# Patient Record
Sex: Male | Born: 1960 | Race: White | Hispanic: No | Marital: Married | Smoking: Current every day smoker
Health system: Southern US, Community
[De-identification: ages and names within clinical notes are randomized; demographics above are authoritative.]

## PROBLEM LIST (undated history)

## (undated) DIAGNOSIS — E7800 Pure hypercholesterolemia, unspecified: Secondary | ICD-10-CM

## (undated) DIAGNOSIS — F17200 Nicotine dependence, unspecified, uncomplicated: Secondary | ICD-10-CM

## (undated) DIAGNOSIS — E109 Type 1 diabetes mellitus without complications: Principal | ICD-10-CM

## (undated) HISTORY — DX: Type 1 diabetes mellitus without complications: E10.9

## (undated) HISTORY — PX: OPEN REDUCTION INTERNAL FIXATION (ORIF) HAND: SHX5991

## (undated) HISTORY — DX: Pure hypercholesterolemia, unspecified: E78.00

## (undated) HISTORY — DX: Nicotine dependence, unspecified, uncomplicated: F17.200

---

## 2005-05-14 ENCOUNTER — Ambulatory Visit (HOSPITAL_COMMUNITY): Admission: RE | Admit: 2005-05-14 | Discharge: 2005-05-14 | Payer: Self-pay | Admitting: Family Medicine

## 2007-02-21 ENCOUNTER — Ambulatory Visit (HOSPITAL_COMMUNITY): Admission: RE | Admit: 2007-02-21 | Discharge: 2007-02-21 | Payer: Self-pay | Admitting: Family Medicine

## 2008-04-16 ENCOUNTER — Encounter: Payer: Self-pay | Admitting: Endocrinology

## 2009-03-31 ENCOUNTER — Encounter: Payer: Self-pay | Admitting: Endocrinology

## 2009-04-25 ENCOUNTER — Ambulatory Visit: Payer: Self-pay | Admitting: Endocrinology

## 2009-04-25 DIAGNOSIS — F17200 Nicotine dependence, unspecified, uncomplicated: Secondary | ICD-10-CM | POA: Insufficient documentation

## 2009-04-25 DIAGNOSIS — Z720 Tobacco use: Secondary | ICD-10-CM | POA: Insufficient documentation

## 2009-04-25 DIAGNOSIS — E109 Type 1 diabetes mellitus without complications: Secondary | ICD-10-CM

## 2009-04-25 HISTORY — DX: Nicotine dependence, unspecified, uncomplicated: F17.200

## 2009-04-25 HISTORY — DX: Type 1 diabetes mellitus without complications: E10.9

## 2009-05-09 ENCOUNTER — Ambulatory Visit: Payer: Self-pay | Admitting: Endocrinology

## 2009-05-30 ENCOUNTER — Ambulatory Visit: Payer: Self-pay | Admitting: Endocrinology

## 2009-06-08 ENCOUNTER — Emergency Department (HOSPITAL_COMMUNITY): Admission: EM | Admit: 2009-06-08 | Discharge: 2009-06-08 | Payer: Self-pay | Admitting: Emergency Medicine

## 2009-06-20 ENCOUNTER — Ambulatory Visit: Payer: Self-pay | Admitting: Endocrinology

## 2009-07-20 ENCOUNTER — Ambulatory Visit: Payer: Self-pay | Admitting: Endocrinology

## 2009-07-20 LAB — CONVERTED CEMR LAB: Hgb A1c MFr Bld: 9.5 % — ABNORMAL HIGH (ref 4.6–6.5)

## 2009-08-19 ENCOUNTER — Ambulatory Visit: Payer: Self-pay | Admitting: Endocrinology

## 2009-09-30 ENCOUNTER — Ambulatory Visit: Payer: Self-pay | Admitting: Endocrinology

## 2009-09-30 LAB — CONVERTED CEMR LAB: Hgb A1c MFr Bld: 9.6 % — ABNORMAL HIGH (ref 4.6–6.5)

## 2009-11-11 ENCOUNTER — Ambulatory Visit: Payer: Self-pay | Admitting: Endocrinology

## 2009-12-27 ENCOUNTER — Ambulatory Visit: Payer: Self-pay | Admitting: Endocrinology

## 2009-12-27 DIAGNOSIS — E7800 Pure hypercholesterolemia, unspecified: Secondary | ICD-10-CM | POA: Insufficient documentation

## 2009-12-27 DIAGNOSIS — E785 Hyperlipidemia, unspecified: Secondary | ICD-10-CM | POA: Insufficient documentation

## 2009-12-27 HISTORY — DX: Pure hypercholesterolemia, unspecified: E78.00

## 2009-12-27 LAB — CONVERTED CEMR LAB
BUN: 11 mg/dL (ref 6–23)
CO2: 25 meq/L (ref 19–32)
Calcium: 9 mg/dL (ref 8.4–10.5)
Chloride: 104 meq/L (ref 96–112)
Cholesterol: 188 mg/dL (ref 0–200)
Creatinine, Ser: 0.7 mg/dL (ref 0.4–1.5)
GFR calc non Af Amer: 129.34 mL/min (ref >60)
Glucose, Bld: 317 mg/dL — ABNORMAL HIGH (ref 70–99)
HDL: 34.6 mg/dL — ABNORMAL LOW (ref >39.00)
Hgb A1c MFr Bld: 10 % — ABNORMAL HIGH (ref 4.6–6.5)
LDL Cholesterol: 136 mg/dL — ABNORMAL HIGH (ref 0–99)
Potassium: 4.5 meq/L (ref 3.5–5.1)
Sodium: 136 meq/L (ref 135–145)
TSH: 1.87 microintl units/mL (ref 0.35–5.50)
Total CHOL/HDL Ratio: 5
Triglycerides: 85 mg/dL (ref 0.0–149.0)
VLDL: 17 mg/dL (ref 0.0–40.0)

## 2010-02-07 ENCOUNTER — Ambulatory Visit
Admission: RE | Admit: 2010-02-07 | Discharge: 2010-02-07 | Payer: Self-pay | Source: Home / Self Care | Attending: Endocrinology | Admitting: Endocrinology

## 2010-02-28 NOTE — Assessment & Plan Note (Signed)
Summary: 2 WK FU  STC   Vital Signs:  Patient profile:   50 year old male Height:      66 inches (167.64 cm) Weight:      165.25 pounds (75.11 kg) O2 Sat:      97 % on Room air Temp:     98.4 degrees F (36.89 degrees C) oral Pulse rate:   81 / minute BP sitting:   118 / 82  (left arm) Cuff size:   regular  Vitals Entered By: Josph Macho RMA (May 09, 2009 8:07 AM)  O2 Flow:  Room air CC: 2 week follow up/ CF Is Patient Diabetic? Yes   Primary Provider:  luking  CC:  2 week follow up/ CF.  History of Present Illness: he is now off oral agents, and has increased lantus to 44 units once daily.  he brings a record of his cbg's which i have reviewed today.  it varies from 72 (afternoon) to 300 (hs and am).    Current Medications (verified): 1)  Lantus Solostar 100 Unit/ml Soln (Insulin Glargine) .... 36 Units Daily At Bedtime, and Pen Needles For Use Once Daily 2)  Accu Check Aviva Test Strips .... Use As Directed  Allergies (verified): No Known Drug Allergies  Past History:  Past Medical History: Last updated: 04/25/2009 Current Problems:  SMOKER (ICD-305.1) IDDM (ICD-250.01)  Review of Systems  The patient denies syncope.    Physical Exam  General:  normal appearance.   Psych:  Alert and cooperative; normal mood and affect; normal attention span and concentration.     Impression & Recommendations:  Problem # 1:  IDDM (ICD-250.01) the pattern if his cbg's indicates he needs mealtime insulin, and less lantus  Medications Added to Medication List This Visit: 1)  Lantus Solostar 100 Unit/ml Soln (Insulin glargine) .... 35 units daily at bedtime, and pen needles for use once daily 2)  Humalog Kwikpen 100 Unit/ml Soln (Insulin lispro (human)) .... 5 units two times a day (before breakfast and before supper), and pen needles to use three times a day  Other Orders: Est. Patient Level III (95621)  Patient Instructions: 1)  reduce lantus to 35 units at  night. 2)  add humalog pen 5 units two times a day (before breakfast and before supper). 3)  return 3 weeks. 4)  call if your blood sugar is below 80 Prescriptions: HUMALOG KWIKPEN 100 UNIT/ML SOLN (INSULIN LISPRO (HUMAN)) 5 units two times a day (before breakfast and before supper), and pen needles to use three times a day  #1 box x 11   Entered and Authorized by:   Minus Breeding MD   Signed by:   Minus Breeding MD on 05/09/2009   Method used:   Print then Give to Patient   RxID:   [id number]

## 2010-02-28 NOTE — Assessment & Plan Note (Signed)
Summary: 6 WK FU---STC   Vital Signs:  Patient profile:   50 year old male Height:      66 inches (167.64 cm) Weight:      169.50 pounds (77.05 kg) BMI:     27.46 O2 Sat:      95 % on Room air Temp:     97.5 degrees F (36.39 degrees C) oral Pulse rate:   65 / minute Pulse rhythm:   regular BP sitting:   120 / 76  (left arm) Cuff size:   regular  Vitals Entered By: Brenton Grills CMA Duncan Dull) (December 27, 2009 8:04 AM)  O2 Flow:  Room air CC: 6 week F/U/aj Is Patient Diabetic? Yes   Primary Provider:  luking  CC:  6 week F/U/aj.  History of Present Illness: pt states he feels well in general.  he brings a record of his cbg's which i have reviewed today.  he has mild hypoglycemia approx 2/week.  this happens when he is unable to anticipate exertion.  the hypoglycemia happens at any time of day, except for the middle of the night.   cbg are highest in am (300's).   Current Medications (verified): 1)  Lantus Solostar 100 Unit/ml Soln (Insulin Glargine) .... 30 Units Daily At Bedtime. 2)  Humalog Kwikpen 100 Unit/ml Soln (Insulin Lispro (Human)) .... Three Times A Day (Just Before Each Meal) 06-08-18 Units, and Pen Needles 4x A Day 3)  Accu-Chek Aviva  Strp (Glucose Blood) .... 5x A Day.  250.01, and Lancets 4)  Glucagon Emergency 1 Mg Kit (Glucagon (Rdna)) .... As Dir  Allergies (verified): No Known Drug Allergies  Past History:  Past Medical History: Last updated: 04/25/2009 Current Problems:  SMOKER (ICD-305.1) IDDM (ICD-250.01)  Review of Systems  The patient denies syncope.    Physical Exam  General:  normal appearance.   Pulses:  dorsalis pedis intact bilat.   Extremities:  no deformity.  no ulcer on the feet.  feet are of normal color and temp.  no edema  Neurologic:  sensation is intact to touch on the feet.  Additional Exam:   Hemoglobin A1C       [H]  10.0 %     Impression & Recommendations:  Problem # 1:  IDDM (ICD-250.01) poor control at some  point, a simpler regimen might have to be considered.  Problem # 2:  SMOKER (ICD-305.1) we discussed quitting x 3 minutes.  he declines quit-smoking prescription.  Medications Added to Medication List This Visit: 1)  Lantus Solostar 100 Unit/ml Soln (Insulin glargine) .... 35 units daily at bedtime.  Other Orders: Est. Patient Level III (81191) Tobacco use cessation intermediate 3-10 minutes (99406) TLB-A1C / Hgb A1C (Glycohemoglobin) (83036-A1C) TLB-Lipid Panel (80061-LIPID) TLB-BMP (Basic Metabolic Panel-BMET) (80048-METABOL) TLB-TSH (Thyroid Stimulating Hormone) (84443-TSH) Est. Patient Level III (47829)  Patient Instructions: 1)  blood tests are being ordered for you today.  please call [phone] to hear your test results. 2)  pending the test results, please: 3)  increase lantus to 35 units at night. 4)  continue humalog pen three times a day (just before each meal), 06-08-18 units.  also take 5 units at bedtime if you choose to eat then. 5)  return 6 weeks. 6)  if exercise is upcoming, skip that shot of humalog.  if you cannot predict activity, eat or drink something to prevent low-blood sugar. you should be more aggressive with eating with activity.  this is because we must eliminate the lows in order  to attack the high blood sugars. 7)  (update: i left message on phone-tree:  i offered two times a day premixed insulin).   Orders Added: 1)  Est. Patient Level III [96295] 2)  Tobacco use cessation intermediate 3-10 minutes [99406] 3)  TLB-A1C / Hgb A1C (Glycohemoglobin) [83036-A1C] 4)  TLB-Lipid Panel [80061-LIPID] 5)  TLB-BMP (Basic Metabolic Panel-BMET) [80048-METABOL] 6)  TLB-TSH (Thyroid Stimulating Hormone) [84443-TSH] 7)  Est. Patient Level III [28413]

## 2010-02-28 NOTE — Assessment & Plan Note (Signed)
Summary: 6 WK FU--STC   Vital Signs:  Patient profile:   50 year old male Height:      66 inches (167.64 cm) Weight:      169 pounds (76.82 kg) BMI:     27.38 O2 Sat:      96 % on Room air Temp:     97.5 degrees F (36.39 degrees C) oral Pulse rate:   72 / minute BP sitting:   112 / 66  (left arm) Cuff size:   regular  Vitals Entered By: Brenton Grills MA (September 30, 2009 8:07 AM)  O2 Flow:  Room air CC: 6 week F/U/aj Is Patient Diabetic? Yes   Primary Provider:  luking  CC:  6 week F/U/aj.  History of Present Illness: he brings a record of his cbg's which i have reviewed today.  it is extremely variable (60-400).  it is highest at hs, and in am, and lowest before lunch (he says this is due to activity at work).  pt states he feels well in general.  he sometimes skips humalog if is is going to be active, but he cannot always predict activity.    Current Medications (verified): 1)  Lantus Solostar 100 Unit/ml Soln (Insulin Glargine) .... 25 Units Daily At Bedtime. 2)  Humalog Kwikpen 100 Unit/ml Soln (Insulin Lispro (Human)) .... Three Times A Day (Just Before Each Meal) 15-10-15 Units, and Pen Needles 4x A Day 3)  Accu-Chek Aviva  Strp (Glucose Blood) .... 5x A Day.  250.01, and Lancets  Allergies (verified): No Known Drug Allergies  Past History:  Past Medical History: Last updated: 04/25/2009 Current Problems:  SMOKER (ICD-305.1) IDDM (ICD-250.01)  Review of Systems  The patient denies hypoglycemia.    Physical Exam  General:  normal appearance.   Skin:  insulin injection sites at triceps and anterior abdomen are normal.  Additional Exam:  Hemoglobin A1C       [H]  9.6 %   Impression & Recommendations:  Problem # 1:  IDDM (ICD-250.01) we'll need to markedly reduce the hypoglycemia before getting better control.  Medications Added to Medication List This Visit: 1)  Humalog Kwikpen 100 Unit/ml Soln (Insulin lispro (human)) .... Three times a day (just  before each meal) 06-08-18 units, and pen needles 4x a day  Other Orders: TLB-A1C / Hgb A1C (Glycohemoglobin) (83036-A1C) Est. Patient Level III (14782)  Patient Instructions: 1)  blood tests are being ordered for you today.  please call [phone] to hear your test results. 2)  pending the test results, please: 3)  continue lantus 25 units at night. 4)  change humalog pen three times a day (just before each meal), 06-08-18 units.  also take 5 units at bedtime if you choose to eat then. 5)  return 6 weeks. 6)  if exercise is upcoming, skip that shot of humalog.  if you cannot predict activity, eat or drink something to prevent low-blood sugar.  7)  (update: i left message on phone-tree:  rx as we discussed)

## 2010-02-28 NOTE — Assessment & Plan Note (Signed)
Summary: 6 wk fu---stc   Vital Signs:  Patient profile:   50 year old male Height:      66 inches (167.64 cm) Weight:      169 pounds (76.82 kg) BMI:     27.38 O2 Sat:      95 % on Room air Temp:     97.0 degrees F (36.11 degrees C) oral Pulse rate:   68 / minute BP sitting:   112 / 72  (left arm) Cuff size:   regular  Vitals Entered By: Brenton Grills MA (November 11, 2009 8:05 AM)  O2 Flow:  Room air CC: 6 week F/U/aj Is Patient Diabetic? Yes   Primary Provider:  luking  CC:  6 week F/U/aj.  History of Present Illness: pt states he feels well in general.  he brings a record of his cbg's which i have reviewed today.   it varies widely--from 55-500.  most are over 250.  the lows are with activity.  in each of the instances of hypoglycemia, he took the full dose of humalog at the prior meal, because he was unable to anticipate the activity.    Current Medications (verified): 1)  Lantus Solostar 100 Unit/ml Soln (Insulin Glargine) .... 25 Units Daily At Bedtime. 2)  Humalog Kwikpen 100 Unit/ml Soln (Insulin Lispro (Human)) .... Three Times A Day (Just Before Each Meal) 06-08-18 Units, and Pen Needles 4x A Day 3)  Accu-Chek Aviva  Strp (Glucose Blood) .... 5x A Day.  250.01, and Lancets  Allergies (verified): No Known Drug Allergies  Past History:  Past Medical History: Last updated: 04/25/2009 Current Problems:  SMOKER (ICD-305.1) IDDM (ICD-250.01)  Review of Systems  The patient denies syncope.    Physical Exam  General:  normal appearance.   Psych:  Alert and cooperative; normal mood and affect; normal attention span and concentration.     Impression & Recommendations:  Problem # 1:  IDDM (ICD-250.01) needs increased rx  Medications Added to Medication List This Visit: 1)  Lantus Solostar 100 Unit/ml Soln (Insulin glargine) .... 30 units daily at bedtime. 2)  Glucagon Emergency 1 Mg Kit (Glucagon (rdna)) .... As dir  Other Orders: Admin 1st Vaccine  (78295) Flu Vaccine 53yrs + (62130) Est. Patient Level III (86578)  Patient Instructions: 1)  increase lantus to 30 units at night. 2)  change humalog pen three times a day (just before each meal), 06-08-18 units.  also take 5 units at bedtime if you choose to eat then. 3)  return 6 weeks. 4)  if exercise is upcoming, skip that shot of humalog.  if you cannot predict activity, eat or drink something to prevent low-blood sugar. you should be more aggressive with eating with activity.  this is because we must eliminate the lows in order to attack the high blood sugars. 5)  i have sent a prescription for "glucagon" (single-use emergency kit to reverse low-blood sugar) to [facility].   Prescriptions: HUMALOG KWIKPEN 100 UNIT/ML SOLN (INSULIN LISPRO (HUMAN)) three times a day (just before each meal) 06-08-18 units, and pen needles 4x a day  #1 box x 11   Entered and Authorized by:   Minus Breeding MD   Signed by:   Minus Breeding MD on 11/11/2009   Method used:   Print then Give to Patient   RxID:   [id number] GLUCAGON EMERGENCY 1 MG KIT (GLUCAGON (RDNA)) as dir  #3 x 5   Entered and Authorized by:   Minus Breeding  MD   Signed by:   Minus Breeding MD on 11/11/2009   Method used:   Electronically to        [facility]* (retail)             ,          Ph: [phone]       Fax: [phone]   RxID:   [id number]  Flu Vaccine Consent Questions     Do you have a history of severe allergic reactions to this vaccine? no    Any prior history of allergic reactions to egg and/or gelatin? no    Do you have a sensitivity to the preservative Thimersol? no    Do you have a past history of Guillan-Barre Syndrome? no    Do you currently have an acute febrile illness? no    Have you ever had a severe reaction to latex? no    Vaccine information given and explained to patient? yes    Are you currently pregnant? no    Lot Number:[id number]   Exp Date:07/29/2010   Site Given  Left Deltoid IM               ,          Ph: [phone]       Fax: [phone]   RxID:   [id number]  .[email or web address]

## 2010-02-28 NOTE — Assessment & Plan Note (Signed)
Summary: 1 MTH FU---STC   Vital Signs:  Patient profile:   50 year old male Height:      66 inches Weight:      171.38 pounds BMI:     27.76 O2 Sat:      97 % on Room air Temp:     97.4 degrees F oral Pulse rate:   66 / minute Pulse rhythm:   regular BP sitting:   102 / 78  (left arm) Cuff size:   regular  Vitals Entered By: Brenton Grills MA (July 20, 2009 8:19 AM)  O2 Flow:  Room air CC: 1 mo f/u/pt states he is no longer ibuprofen or vicodin/aj   Primary Provider:  luking  CC:  1 mo f/u/pt states he is no longer ibuprofen or vicodin/aj.  History of Present Illness: pt is having mild hypoglycemia with exertion (and only with exertion), even if he skips the previous shot of humalog.  he often skips the 5 units of humalog if he eats at hs.  he brings a record of his cbg's which i have reviewed today.   Current Medications (verified): 1)  Lantus Solostar 100 Unit/ml Soln (Insulin Glargine) .... 45 Units Daily At Bedtime. 2)  Humalog Kwikpen 100 Unit/ml Soln (Insulin Lispro (Human)) .... Three Times A Day (Just Before Each Meal) 11-02-08 Units, and Pen Needles 4x A Day 3)  Ibuprofen 600 Mg Tabs (Ibuprofen) .... As Needed For Shoulder Pain 4)  Vicodin 5-500 Mg Tabs (Hydrocodone-Acetaminophen) .Marland Kitchen.. 1-2 Q 4-6 Hrs As Needed For Shoulder Pain 5)  Accu-Chek Aviva  Strp (Glucose Blood) .... 5x A Day.  250.01, and Lancets  Allergies (verified): No Known Drug Allergies  Review of Systems  The patient denies syncope.    Physical Exam  General:  normal appearance.   Pulses:  dorsalis pedis intact bilat.   Extremities:  no deformity.  no ulcer on the feet.  feet are of normal color and temp.  no edema  Neurologic:  cn 2-12 grossly intact.   readily moves all 4's.   sensation is intact to touch on the feet.  Additional Exam:  Hemoglobin A1C       [H]  9.5 %    Impression & Recommendations:  Problem # 1:  IDDM (ICD-250.01) needs increased rx  Medications Added to  Medication List This Visit: 1)  Lantus Solostar 100 Unit/ml Soln (Insulin glargine) .... 30 units daily at bedtime. 2)  Humalog Kwikpen 100 Unit/ml Soln (Insulin lispro (human)) .... Three times a day (just before each meal) 15-10-15 units, and pen needles 4x a day  Other Orders: TLB-A1C / Hgb A1C (Glycohemoglobin) (83036-A1C) Est. Patient Level III (82956)  Patient Instructions: 1)  decrease lantus to 30 units at night. 2)  increase humalog pen to three times a day (just before each meal), 15-10-15 units.  also take 5 units at bedtime if you choose to eat then 3)  return 1 month. 4)  call if your blood sugar is below 80. 5)  if exercise is upcoming, skip that shot of humalog. 6)  blood tests are being ordered for you today.  please call [phone] to hear your test results. 7)  here are some samples of "apidra" pens.  please note that, while they look like lantus, they are a substiitute for humalog, not lantus.

## 2010-02-28 NOTE — Assessment & Plan Note (Signed)
Summary: 3 WK FU  STC   Vital Signs:  Patient profile:   50 year old male Height:      66 inches Weight:      165 pounds BMI:     26.73 O2 Sat:      98 % on Room air Temp:     97.5 degrees F oral Pulse rate:   65 / minute BP sitting:   110 / 68  (left arm) Cuff size:   regular  Vitals Entered By: Bill Salinas CMA (May 30, 2009 8:09 AM)  O2 Flow:  Room air CC: 3 week follow up, pt brought a record of his CBGs and they are still running over 200/ ab   Primary Provider:  luking  CC:  3 week follow up and pt brought a record of his CBGs and they are still running over 200/ ab.  History of Present Illness: he brings a record of his cbg's which i have reviewed today.  he has had several episodes of mild hypoglycemia, after exercise, or taking humalog but not eating.   Current Medications (verified): 1)  Lantus Solostar 100 Unit/ml Soln (Insulin Glargine) .... 35 Units Daily At Bedtime, and Pen Needles For Use Once Daily 2)  Accu Check Aviva Test Strips .... Use As Directed 3)  Humalog Kwikpen 100 Unit/ml Soln (Insulin Lispro (Human)) .... 5 Units Two Times A Day (Before Breakfast and Before Supper), and Pen Needles To Use Three Times A Day  Allergies (verified): No Known Drug Allergies  Past History:  Past Medical History: Last updated: 04/25/2009 Current Problems:  SMOKER (ICD-305.1) IDDM (ICD-250.01)  Review of Systems  The patient denies syncope.    Physical Exam  General:  normal appearance.   Psych:  Alert and cooperative; normal mood and affect; normal attention span and concentration.     Impression & Recommendations:  Problem # 1:  IDDM (ICD-250.01) pump therapy is the best way to address his varying needs from day to day.  Medications Added to Medication List This Visit: 1)  Lantus Solostar 100 Unit/ml Soln (Insulin glargine) .... 30 units daily at bedtime, and pen needles for use once daily 2)  Humalog Kwikpen 100 Unit/ml Soln (Insulin lispro (human))  .... 8 units two times a day (before breakfast and before supper), and pen needles to use three times a day  Other Orders: Est. Patient Level III (46270)  Patient Instructions: 1)  reduce lantus to 30 units at night. 2)  increase humalog pen to 8 units two times a day (before breakfast and before supper). 3)  return 3 weeks. 4)  call if your blood sugar is below 80. 5)  the humalog if just before eating, and only when you eat. 6)  if exercise is upcoming, skip that shot of humalog. 7)  you should consider the insulin pump to best meet your needs.  please let me know if you want to have an appointment with a diabetes educator to consider it.

## 2010-02-28 NOTE — Assessment & Plan Note (Signed)
Summary: NEW [employer] ENDO PT--PKG/OFF-UNCONTROL DIABETES--PER BRENDELL/DR...   Vital Signs:  Patient profile:   50 year old male Height:      66 inches (167.64 cm) Weight:      162.50 pounds (73.86 kg) BMI:     26.32 O2 Sat:      99 % on Room air Temp:     98.0 degrees F (36.67 degrees C) oral Pulse rate:   74 / minute BP sitting:   132 / 76  (left arm) Cuff size:   regular  Vitals Entered By: Josph Machohristy Freeman RMA (April 25, 2009 8:40 AM)  O2 Flow:  Room air CC: New Endo: Uncontrolled Diabetes/ CF Is Patient Diabetic? Yes   CC:  New Endo: Uncontrolled Diabetes/ CF.  History of Present Illness: pt states 1 year h/o dm.  he denies knowing of any chronic complications.  he has been on insulin x 2 months.  he takes lantus and 2 oral agents. no cbg record, but states cbg's vary from 40-300.  it is lowest in the afternoon, and in am, and highest at hs.  he has frequent hypoglycemia in am and afternoon. pt says his diet and exercise are "good."   symptomatically, pt denies numbness of the feet over the past year, or associated weight change.   Current Medications (verified): 1)  Lantus Solostar 100 Unit/ml Soln (Insulin Glargine) .... 36 Units Daily At Bedtime 2)  Actos 30 Mg Tabs (Pioglitazone Hcl) .Marland Kitchen... 1 Tab Daily 3)  Metformin Hcl 1000 Mg Tabs (Metformin Hcl) .Marland Kitchen... 1 Tab After A Morning Meal 1/2 Tab After A Meal A Noon 1 Tab After Night Meal 4)  Glyburide 5 Mg Tabs (Glyburide) .Marland Kitchen... 1 Tab in Morning and 2 At Pm 5)  Accu Check Aviva Test Strips .... Use As Directed  Allergies (verified): No Known Drug Allergies  Past History:  Past Medical History: Current Problems:  SMOKER (ICD-305.1) IDDM (ICD-250.01)  Family History: Reviewed history and no changes required. dm: both parents  Social History: Reviewed history and no changes required. married works Medical sales representativeservicing textile equipment.  Review of Systems       denies blurry vision, headache, chest pain, sob, n/v, cramps,  excessive diaphoresis, memory loss, depression, erectily dysfunction, easy bruising, or rhinorrhea.  he has slight blurry vision and polyuria.   Physical Exam  General:  normal appearance.   Head:  head: no deformity eyes: no periorbital swelling, no proptosis external nose and ears are normal mouth: no lesion seen Neck:  Supple without thyroid enlargement or tenderness.  Lungs:  Clear to auscultation bilaterally. Normal respiratory effort.  Heart:  Regular rate and rhythm without murmurs or gallops noted. Normal S1,S2.   Msk:  muscle bulk and strength are grossly normal.  no obvious joint swelling.  gait is normal and steady  Pulses:  dorsalis pedis intact bilat.  no carotid bruit Extremities:  no deformity.  no ulcer on the feet.  feet are of normal color and temp.  no edema  Neurologic:  cn 2-12 grossly intact.   readily moves all 4's.   sensation is intact to touch on the feet  Skin:  normal texture and temp.  no rash.  not diaphoretic  Cervical Nodes:  No significant adenopathy.  Psych:  Alert and cooperative; normal mood and affect; normal attention span and concentration.   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 36 units daily at bedtime 2)  Lantus Solostar 100 Unit/ml Soln (Insulin glargine) .... 36 units daily at bedtime, and pen needles for use once daily 3)  Actos 30 Mg Tabs (Pioglitazone hcl) .Marland Kitchen.. 1 tab daily 4)  Metformin Hcl 1000 Mg Tabs (Metformin hcl) .Marland Kitchen.. 1 tab after a morning meal 1/2 tab after a meal a noon 1 tab after night meal 5)  Glyburide 5 Mg Tabs (Glyburide) .Marland Kitchen.. 1 tab in morning and 2 at pm 6)  Accu Check Aviva Test  Strips  .... Use as directed  Other Orders: Consultation Level IV (41287)  Patient Instructions: 1)  we discussed the importance of diet and exercise therapy and the risks of diabetes.  you should see an eye doctor every year. 2)  it is very important to keep good control of blood pressure and cholesterol, especially in those with diabetes.  stopping smoking also reduces the damage diabetes does to your body.  please discuss these with your doctor.  you should take an aspirin every day, unless you have been advised by a doctor not to. 3)  i told pt we will need to take this complex situation in stages 4)  check your blood sugar 3 times a day.  vary the time of day when you check, between before the 3 meals, and at bedtime.  also check if you have symptoms of your blood sugar being too high or too low.  please keep a record of the readings and bring it to your next appointment here.  please call us sooner if you are having low blood sugar episodes. 5)  for now, stop the diabetes pills.  then increase lantus until blood sugar at some time of day is in the low-100's. 6)  Please schedule a follow-up appointment in 2 weeks. Prescriptions: LANTUS SOLOSTAR 100 UNIT/ML SOLN (INSULIN GLARGINE) 36 units daily at bedtime, and pen needles for use once daily  #3 boxes x 3   Entered and Authorized by:   Minus Breeding MD   Signed by:   Minus Breeding MD on 04/25/2009   Method used:   Electronically to        [facility]* (mail-order)             ,          Ph: [phone]       Fax: [phone]   RxID:   [id number]

## 2010-02-28 NOTE — Assessment & Plan Note (Signed)
Summary: 1 MO ROV /NWS   Vital Signs:  Patient profile:   50 year old male Height:      66 inches (167.64 cm) Weight:      167.75 pounds (76.25 kg) BMI:     27.17 O2 Sat:      98 % on Room air Temp:     97.0 degrees F (36.11 degrees C) oral Pulse rate:   70 / minute BP sitting:   132 / 80  (left arm) Cuff size:   regular  Vitals Entered By: Brenton Grills MA (August 19, 2009 8:09 AM)  O2 Flow:  Room air CC: 1 mo F/U/aj Is Patient Diabetic? Yes   Primary Provider:  luking  CC:  1 mo F/U/aj.  History of Present Illness: he brings a record of his cbg's which i have reviewed today.  it is extremely variable (60-400), with no pattern throughout the day.  pt states he feels well in general.  he sometimes skips humalog if is is goig to be active, but he cannot always predict activity.    Current Medications (verified): 1)  Lantus Solostar 100 Unit/ml Soln (Insulin Glargine) .... 30 Units Daily At Bedtime. 2)  Humalog Kwikpen 100 Unit/ml Soln (Insulin Lispro (Human)) .... Three Times A Day (Just Before Each Meal) 15-10-15 Units, and Pen Needles 4x A Day 3)  Accu-Chek Aviva  Strp (Glucose Blood) .... 5x A Day.  250.01, and Lancets  Allergies (verified): No Known Drug Allergies  Past History:  Past Medical History: Last updated: 04/25/2009 Current Problems:  SMOKER (ICD-305.1) IDDM (ICD-250.01)  Review of Systems  The patient denies syncope.    Physical Exam  General:  normal appearance.   Psych:  Alert and cooperative; normal mood and affect; normal attention span and concentration.     Impression & Recommendations:  Problem # 1:  IDDM (ICD-250.01) Assessment Improved  Medications Added to Medication List This Visit: 1)  Lantus Solostar 100 Unit/ml Soln (Insulin glargine) .... 25 units daily at bedtime.  Other Orders: Est. Patient Level III (76195)  Patient Instructions: 1)  decrease lantus to 25 units at night. 2)  continue humalog pen three times a day (just  before each meal), 15-10-15 units.  also take 5 units at bedtime if you choose to eat then. 3)  return 6 weeks. 4)  if exercise is upcoming, skip that shot of humalog.  if you cannot predict activity, eat or drink something to prevent low-blood sugar.

## 2010-02-28 NOTE — Assessment & Plan Note (Signed)
Summary: 3 WK FU  STC   Vital Signs:  Patient profile:   50 year old male Height:      66 inches (167.64 cm) Weight:      165.50 pounds (75.23 kg) O2 Sat:      97 % on Room air Temp:     97.9 degrees F (36.61 degrees C) oral Pulse rate:   69 / minute BP sitting:   112 / 80  (left arm) Cuff size:   regular  Vitals Entered By: Josph Machohristy Freeman RMA (Jun 20, 2009 8:20 AM)  O2 Flow:  Room air CC: 3 week follow up/ CF Is Patient Diabetic? Yes   Primary Provider:  luking  CC:  3 week follow up/ CF.  History of Present Illness: he brings a record of his cbg's which i have reviewed today.  it is highest in am (300's), despite the fact that he eats only a minimal hs-snack.    Current Medications (verified): 1)  Lantus Solostar 100 Unit/ml Soln (Insulin Glargine) .... 30 Units Daily At Bedtime, and Pen Needles For Use Once Daily 2)  Accu Check Aviva Test Strips .... Use As Directed 3)  Humalog Kwikpen 100 Unit/ml Soln (Insulin Lispro (Human)) .... 8 Units Two Times A Day (Before Breakfast and Before Supper), and Pen Needles To Use Three Times A Day 4)  Ibuprofen 600 Mg Tabs (Ibuprofen) .... As Needed For Shoulder Pain 5)  Vicodin 5-500 Mg Tabs (Hydrocodone-Acetaminophen) .Marland Kitchen... 1-2 Q 4-6 Hrs As Needed For Shoulder Pain  Allergies (verified): No Known Drug Allergies  Past History:  Past Medical History: Last updated: 04/25/2009 Current Problems:  SMOKER (ICD-305.1) IDDM (ICD-250.01)  Review of Systems  The patient denies hypoglycemia.    Physical Exam  General:  normal appearance.   Psych:  Alert and cooperative; normal mood and affect; normal attention span and concentration.     Impression & Recommendations:  Problem # 1:  IDDM (ICD-250.01) hs snack may be causing the high am-cbg's.  Medications Added to Medication List This Visit: 1)  Lantus Solostar 100 Unit/ml Soln (Insulin glargine) .... 45 units daily at bedtime. 2)  Humalog Kwikpen 100 Unit/ml Soln (Insulin  lispro (human)) .... Three times a day (just before each meal) 11-02-08 units, and pen needles 4x a day 3)  Ibuprofen 600 Mg Tabs (Ibuprofen) .... As needed for shoulder pain 4)  Vicodin 5-500 Mg Tabs (Hydrocodone-acetaminophen) .Marland Kitchen... 1-2 q 4-6 hrs as needed for shoulder pain 5)  Accu-chek Aviva Strp (Glucose blood) .... 5x a day.  250.01, and lancets  Other Orders: Est. Patient Level III ([id number]([id number])  Patient Instructions: 1)  increase lantus to 45 units at night. 2)  increase humalog pen to three times a day (just before each meal), 11-02-08 units.  also take 5 units at bedtime if you choose to eat then 3)  return 1 month. 4)  call if your blood sugar is below 80. 5)  if exercise is upcoming, skip that shot of humalog. Prescriptions: ACCU-CHEK AVIVA  STRP (GLUCOSE BLOOD) 5x a day.  250.01, and lancets  #540 x 3   Entered and Authorized by:   Minus BreedingSean A Delmar Dondero MD   Signed by:   Minus BreedingSean A Trevan Messman MD on 06/20/2009   Method used:   Electronically to        [facility]* (mail-order)             ,          Ph: [phone]  Fax: [phone]   RxID:   [id number]

## 2010-03-02 NOTE — Assessment & Plan Note (Signed)
Summary: 6 WK FU  STC   Vital Signs:  Patient profile:   50 year old male Height:      66 inches (167.64 cm) Weight:      173 pounds (78.64 kg) BMI:     28.02 O2 Sat:      97 % on Room air Temp:     98.0 degrees F (36.67 degrees C) oral Pulse rate:   66 / minute Pulse rhythm:   regular BP sitting:   110 / 78  (left arm) Cuff size:   regular  Vitals Entered By: Brenton GrillsAshley Johnson CMA Duncan Dull(AAMA) (February 07, 2010 8:03 AM)  O2 Flow:  Room air CC: 6 week F/U/aj Is Patient Diabetic? Yes   Primary Provider:  luking  CC:  6 week F/U/aj.  History of Present Illness: pt states he feels well in general.  he starts to get lightheaded when his glucose goes below 100.  he brings a record of his cbg's which i have reviewed today.  it varies from 53-400, with no trend throughout the day.  he says his insulin requirements are no different on days he works.    Current Medications (verified): 1)  Lantus Solostar 100 Unit/ml Soln (Insulin Glargine) .... 35 Units Daily At Bedtime. 2)  Humalog Kwikpen 100 Unit/ml Soln (Insulin Lispro (Human)) .... Three Times A Day (Just Before Each Meal) 06-08-18 Units, and Pen Needles 4x A Day 3)  Accu-Chek Aviva  Strp (Glucose Blood) .... 5x A Day.  250.01, and Lancets 4)  Glucagon Emergency 1 Mg Kit (Glucagon (Rdna)) .... As Dir  Allergies (verified): No Known Drug Allergies  Past History:  Past Medical History: Last updated: 04/25/2009 Current Problems:  SMOKER (ICD-305.1) IDDM (ICD-250.01)  Review of Systems  The patient denies syncope.    Physical Exam  General:  normal appearance.   Psych:  Alert and cooperative; normal mood and affect; normal attention span and concentration.     Impression & Recommendations:  Problem # 1:  IDDM (ICD-250.01) here we should try a simpler regimen  Medications Added to Medication List This Visit: 1)  Novolog Mix 70/30 Flexpen 70-30 % Susp (Insulin aspart prot & aspart) .... 40 units with breakfast, and 25 units  with evening meal, and pen needles two times a day  Other Orders: Est. Patient Level III ([id number]([id number])  Patient Instructions: 1)  change both current insulins to novolog mix 70/30, 40 units with breakfast, and 25 units with the evening meal. 2)  Please schedule a follow-up appointment in 6 weeks. 3)  check your blood sugar 4 times a day--before the 3 meals, and at bedtime.  also check if you have symptoms of your blood sugar being too high or too low.  please keep a record of the readings and bring it to your next appointment here.  please call us sooner if you are having low blood sugar episodes, or if it is persistently over 200. Prescriptions: NOVOLOG MIX 70/30 FLEXPEN 70-30 % SUSP (INSULIN ASPART PROT & ASPART) 40 units with breakfast, and 25 units with evening meal, and pen needles two times a day  #2 boxes x 11   Entered and Authorized by:   Minus BreedingSean A Rashad Obeid MD   Signed by:   Minus BreedingSean A Annayah Worthley MD on 02/07/2010   Method used:   Electronically to        [facility]* (retail)       [address]       [place]  [place], [place]  [address]       Ph: [phone]       Fax: [phone]   RxID:   [id number]    Orders Added: 1)  Est. Patient Level III [43329]

## 2010-03-21 ENCOUNTER — Ambulatory Visit (INDEPENDENT_AMBULATORY_CARE_PROVIDER_SITE_OTHER): Payer: BC Managed Care – PPO | Admitting: Endocrinology

## 2010-03-21 ENCOUNTER — Encounter: Payer: Self-pay | Admitting: Endocrinology

## 2010-03-21 DIAGNOSIS — E109 Type 1 diabetes mellitus without complications: Secondary | ICD-10-CM

## 2010-03-28 NOTE — Assessment & Plan Note (Signed)
Summary: 6 WK FU STC   Vital Signs:  Patient profile:   50 year old male Height:      66 inches (167.64 cm) Weight:      174.13 pounds (79.15 kg) BMI:     28.21 O2 Sat:      97 % on Room air Temp:     98.0 degrees F (36.67 degrees C) oral Pulse rate:   67 / minute Pulse rhythm:   regular BP sitting:   108 / 78  (left arm) Cuff size:   regular  Vitals Entered By: Brenton Grills CMA Duncan Dull) (March 21, 2010 8:05 AM)  O2 Flow:  Room air CC: 6 week F/U/aj Is Patient Diabetic? Yes   Primary Provider:  luking  CC:  6 week F/U/aj.  History of Present Illness: pt states he feels well in general.  he brings a record of his cbg's which i have reviewed today.  it is often low during the day with exertion, or in am if he does not eat at hs. he often takes less than the prescribed insulin dosage in am, then takes some at lunch.  he then sometimes takes extra insulin with the evening meal.    Current Medications (verified): 1)  Accu-Chek Aviva  Strp (Glucose Blood) .... 5x A Day.  250.01, and Lancets 2)  Glucagon Emergency 1 Mg Kit (Glucagon (Rdna)) .... As Dir 3)  Novolog Mix 70/30 Flexpen 70-30 % Susp (Insulin Aspart Prot & Aspart) .... 40 Units With Breakfast, and 25 Units With Evening Meal, and Pen Needles Two Times A Day  Allergies (verified): No Known Drug Allergies  Past History:  Past Medical History: Last updated: 04/25/2009 Current Problems:  SMOKER (ICD-305.1) IDDM (ICD-250.01)  Review of Systems  The patient denies syncope.    Physical Exam  General:  normal appearance.   Neck:  Supple without thyroid enlargement or tenderness.    Impression & Recommendations:  Problem # 1:  IDDM (ICD-250.01) this insulin schedule is still too complex for him.  we discussed this vs qam levemir, and he wants to continue the same insulin type for now.    Medications Added to Medication List This Visit: 1)  Novolog Mix 70/30 Flexpen 70-30 % Susp (Insulin aspart prot & aspart)  .... 35 units with breakfast, and 25 units with evening meal, and pen needles two times a day  Other Orders: Est. Patient Level III (60454)  Patient Instructions: 1)  change novolog mix 70/30, to 35 units with breakfast (25 if you are going to be active during the day), and 25 units with the evening meal.  if you are unable to anticipate activity, eat a light snack for the activity. 2)  Please schedule a follow-up appointment in 1 month. 3)  check your blood sugar 4 times a day--before the 3 meals, and at bedtime.  also check if you have symptoms of your blood sugar being too high or too low.  please keep a record of the readings and bring it to your next appointment here.  please call us sooner if you are having low blood sugar episodes, or if it is persistently over 200.   Orders Added: 1)  Est. Patient Level III [09811]

## 2010-04-21 ENCOUNTER — Encounter: Payer: Self-pay | Admitting: Endocrinology

## 2010-04-21 ENCOUNTER — Ambulatory Visit (INDEPENDENT_AMBULATORY_CARE_PROVIDER_SITE_OTHER): Payer: BC Managed Care – PPO | Admitting: Endocrinology

## 2010-04-21 ENCOUNTER — Other Ambulatory Visit (INDEPENDENT_AMBULATORY_CARE_PROVIDER_SITE_OTHER): Payer: BC Managed Care – PPO

## 2010-04-21 VITALS — BP 108/72 | HR 67 | Temp 97.9°F | Ht 66.0 in | Wt 172.6 lb

## 2010-04-21 DIAGNOSIS — E1069 Type 1 diabetes mellitus with other specified complication: Secondary | ICD-10-CM

## 2010-04-21 DIAGNOSIS — E109 Type 1 diabetes mellitus without complications: Secondary | ICD-10-CM

## 2010-04-21 DIAGNOSIS — E1065 Type 1 diabetes mellitus with hyperglycemia: Secondary | ICD-10-CM

## 2010-04-21 LAB — HEMOGLOBIN A1C: Hgb A1c MFr Bld: 9.8 % — ABNORMAL HIGH (ref 4.6–6.5)

## 2010-04-21 NOTE — Progress Notes (Signed)
  Subjective:    Patient ID: Ronald Wilkinson, male    DOB: 05/01/1960, 50 y.o.   MRN: [id number]  HPI he brings a record of his cbg's which i have reviewed today.  He continues to have frequent hypoglycemia during the day.  This is happening both because he is sometimes unable to anticipate activity, and sometimes because it happens despite taking only 25 units in the morning.  In the am, cbg's are 200-400.   Past Medical History  Diagnosis Date  . IDDM 04/25/2009  . HYPERCHOLESTEROLEMIA 12/27/2009  . SMOKER 04/25/2009   No past surgical history on file.  reports that he has been smoking.  He does not have any smokeless tobacco history on file. He reports that he does not drink alcohol or use illicit drugs. family history includes Diabetes in his father and mother. No Known Allergies   Review of Systems Denies loc    Objective:   Physical Exam dorsalis pedis intact bilat.  no carotid bruit no deformity.  no ulcer on the feet.  feet are of normal color and temp.  no edema sensation is intact to touch on the feet      Assessment & Plan:  Dm, overcontrolled due to activity

## 2010-04-21 NOTE — Patient Instructions (Addendum)
change novolog mix 70/30, to 25 units with breakfast (15 if you are going to be active during the day), and 25 units with the evening meal.  if you are unable to anticipate activity, eat a light snack for the activity. Please schedule a follow-up appointment in 1 month. check your blood sugar 4 times a day--before the 3 meals, and at bedtime.  also check if you have symptoms of your blood sugar being too high or too low.  please keep a record of the readings and bring it to your next appointment here.  please call us sooner if you are having low blood sugar episodes, or if it is persistently over 200.

## 2010-05-26 ENCOUNTER — Ambulatory Visit (INDEPENDENT_AMBULATORY_CARE_PROVIDER_SITE_OTHER): Payer: BC Managed Care – PPO | Admitting: Endocrinology

## 2010-05-26 ENCOUNTER — Encounter: Payer: Self-pay | Admitting: Endocrinology

## 2010-05-26 DIAGNOSIS — E109 Type 1 diabetes mellitus without complications: Secondary | ICD-10-CM

## 2010-05-26 NOTE — Progress Notes (Signed)
  Subjective:    Patient ID: Ronald Wilkinson, male    DOB: [birth year], 50 y.o.   MRN: [id number]  HPI he brings a record of his cbg's which i have reviewed today.  It varies from 80-300.  It is still lowest in the afternoon.  He says this happens when he is unable to anticipate activity during the day.  pt states he feels well in general. Past Medical History  Diagnosis Date  . IDDM 04/25/2009  . HYPERCHOLESTEROLEMIA 12/27/2009  . SMOKER 04/25/2009   No past surgical history on file.  reports that he has been smoking.  He does not have any smokeless tobacco history on file. He reports that he does not drink alcohol or use illicit drugs. family history includes Diabetes in his father and mother. No Known Allergies  Review of Systems denies hypoglycemia.    Objective:   Physical Exam GENERAL: no distress Alert and oriented x 3.  Does not appear anxious nor depressed.      Assessment & Plan:  Dm, needs increased rx

## 2010-05-26 NOTE — Patient Instructions (Addendum)
change novolog mix 70/30, to 35 units with breakfast (25 if you are going to be active during the day), and 40 units with the evening meal.  if you are unable to anticipate activity, eat a light snack for the activity. Please schedule a follow-up appointment in 1 month. check your blood sugar 4 times a day--before the 3 meals, and at bedtime.  also check if you have symptoms of your blood sugar being too high or too low.  please keep a record of the readings and bring it to your next appointment here.  please call us sooner if you are having low blood sugar episodes, or if it is persistently over 200.

## 2010-06-04 ENCOUNTER — Other Ambulatory Visit: Payer: Self-pay | Admitting: Endocrinology

## 2010-06-30 ENCOUNTER — Ambulatory Visit (INDEPENDENT_AMBULATORY_CARE_PROVIDER_SITE_OTHER): Payer: BC Managed Care – PPO | Admitting: Endocrinology

## 2010-06-30 ENCOUNTER — Encounter: Payer: Self-pay | Admitting: Endocrinology

## 2010-06-30 DIAGNOSIS — E109 Type 1 diabetes mellitus without complications: Secondary | ICD-10-CM

## 2010-06-30 NOTE — Progress Notes (Signed)
  Subjective:    Patient ID: Ronald Wilkinson, male    DOB: 08-07-1960, 50 y.o.   MRN: [id number]  HPI pt states he feels well in general.  Pt says cbg's are continuing to improve.  he brings a record of his cbg's which i have reviewed today.  It is still low with activity.  He says he is taking only 20 units with breakfast.  He then takes the additional 15 units later, depending on his cbg and activity.  He does about the same with his pm insulin.  cbg's are mildly low approx 2/week, with no pattern throughout the day.  It is sometimes as high as 400, but most are 100-220. Past Medical History  Diagnosis Date  . IDDM 04/25/2009  . HYPERCHOLESTEROLEMIA 12/27/2009  . SMOKER 04/25/2009    No past surgical history on file.  History   Social History  . Marital Status: Married    Spouse Name: N/A    Number of Children: N/A  . Years of Education: N/A   Occupational History  .      textiles   Social History Main Topics  . Smoking status: Current Everyday Smoker -- 1.5 packs/day  . Smokeless tobacco: Not on file  . Alcohol Use: No  . Drug Use: No  . Sexually Active: Not on file   Other Topics Concern  . Not on file   Social History Narrative   Works Medical sales representative.    Current Outpatient Prescriptions on File Prior to Visit  Medication Sig Dispense Refill  . ACCU-CHEK AVIVA PLUS test strip USE 5 TIMES A DAY  540 strip  2  . glucagon (GLUCAGON EMERGENCY) 1 MG injection As directed       . insulin aspart protamine-insulin aspart (NOVOLOG MIX 70/30 FLEXPEN) (70-30) 100 UNIT/ML injection Inject into the skin. 35 units with breakfast, and 40 with evening meal, and pen needles 2/day.         No Known Allergies  Family History  Problem Relation Age of Onset  . Diabetes Mother   . Diabetes Father     BP 128/82  Pulse 70  Temp(Src) 97.4 F (36.3 C) (Oral)  Ht 6' (1.829 m)  Wt 173 lb 12.8 oz (78.835 kg)  BMI 23.57 kg/m2  SpO2 95%    Review of  Systems Denies loc    Objective:   Physical Exam Pulses: dorsalis pedis intact bilat.   Feet: no deformity.  no ulcer on the feet.  feet are of normal color and temp.  no edema Neuro: sensation is intact to touch on the feet.       Assessment & Plan:  Type 1 dm.  therapy limited by his constant varying of the insulin dosage.  Also, cbg's depend heavily on activity.

## 2010-06-30 NOTE — Patient Instructions (Addendum)
change novolog mix 70/30, to 25 units with breakfast, and 40 units with the evening meal.  if you are unable to anticipate activity, eat a light snack for the activity. Please schedule a follow-up appointment in 4-6 weeks.   check your blood sugar 4 times a day--before the 3 meals, and at bedtime.  also check if you have symptoms of your blood sugar being too high or too low.  please keep a record of the readings and bring it to your next appointment here.  please call us sooner if you are having low blood sugar episodes, or if it is persistently over 200.

## 2010-08-04 ENCOUNTER — Ambulatory Visit: Payer: BC Managed Care – PPO | Admitting: Endocrinology

## 2011-03-10 ENCOUNTER — Emergency Department (HOSPITAL_COMMUNITY)
Admission: EM | Admit: 2011-03-10 | Discharge: 2011-03-10 | Disposition: A | Discharge status: Home / Self Care | Payer: BC Managed Care – PPO | Attending: Emergency Medicine | Admitting: Emergency Medicine

## 2011-03-10 ENCOUNTER — Encounter (HOSPITAL_COMMUNITY): Payer: Self-pay

## 2011-03-10 DIAGNOSIS — X500XXA Overexertion from strenuous movement or load, initial encounter: Secondary | ICD-10-CM | POA: Insufficient documentation

## 2011-03-10 DIAGNOSIS — F17200 Nicotine dependence, unspecified, uncomplicated: Secondary | ICD-10-CM | POA: Insufficient documentation

## 2011-03-10 DIAGNOSIS — S99929A Unspecified injury of unspecified foot, initial encounter: Secondary | ICD-10-CM | POA: Insufficient documentation

## 2011-03-10 DIAGNOSIS — S8990XA Unspecified injury of unspecified lower leg, initial encounter: Secondary | ICD-10-CM | POA: Insufficient documentation

## 2011-03-10 DIAGNOSIS — E119 Type 2 diabetes mellitus without complications: Secondary | ICD-10-CM | POA: Insufficient documentation

## 2011-03-10 DIAGNOSIS — M25569 Pain in unspecified knee: Secondary | ICD-10-CM | POA: Insufficient documentation

## 2011-03-10 DIAGNOSIS — M79609 Pain in unspecified limb: Secondary | ICD-10-CM | POA: Insufficient documentation

## 2011-03-10 DIAGNOSIS — E7800 Pure hypercholesterolemia, unspecified: Secondary | ICD-10-CM | POA: Insufficient documentation

## 2011-03-10 DIAGNOSIS — S86909A Unspecified injury of unspecified muscle(s) and tendon(s) at lower leg level, unspecified leg, initial encounter: Secondary | ICD-10-CM

## 2011-03-10 MED ORDER — OXYCODONE-ACETAMINOPHEN 5-325 MG PO TABS: 1.0000 | ORAL_TABLET | ORAL | Status: AC | PRN

## 2011-03-10 MED ORDER — NAPROXEN 500 MG PO TABS: 500.0000 mg | ORAL_TABLET | Freq: Two times a day (BID) | ORAL | Status: AC

## 2011-03-10 NOTE — ED Notes (Signed)
Having left lower leg pain, twisted and felt a pop around 10:30am today.

## 2011-03-10 NOTE — ED Notes (Signed)
Pt a/ox4. Resp even and unlabored. NAD at this time. D/C instructions reviewed with pt. Pt verbalized understanding. Pt verbalized understanding. Pt ambulated to lobby with steady gate.

## 2011-03-10 NOTE — ED Notes (Signed)
Pt presented to the Ed with crutches and a problem in the left leg. Pt states that he was squatting earlier today and when he turned the leg "gave out" and he hear a "pop" he fell to the floor. Pt states that he is unable to walk or put any weight on it. Pt states at home he put ice on his leg. Pt states he put ice on it and it has not gotten better. Pt states that his pain is a 8 on a scale of 0-10. Pt is alert and oriented X 4 and resp even and unlabored.

## 2011-03-13 NOTE — ED Provider Notes (Signed)
History     CSN: [id number]  Arrival date & time 03/10/11  1719   First MD Initiated Contact with Patient 03/10/11 1815      Chief Complaint  Patient presents with  . Leg Pain    (Consider location/radiation/quality/duration/timing/severity/associated sxs/prior treatment) HPI Comments: Patient c/o pain to the left lateral knee that began suddenly while he squatting down and twisted.  States he felt a "pop" to the knee and now has pain with weight bearing or movement .  He denies numbness, swelling or calf pain.  Patient is a 51 y.o. male presenting with knee pain. The history is provided by the patient. No language interpreter was used.  Knee Pain This is a new problem. The current episode started today. The problem occurs constantly. The problem has been unchanged. Associated symptoms include arthralgias. Pertinent negatives include no chest pain, fatigue, fever, joint swelling, myalgias, neck pain, numbness, rash or weakness. The symptoms are aggravated by twisting, walking and standing. He has tried nothing for the symptoms. The treatment provided no relief.    Past Medical History  Diagnosis Date  . IDDM 04/25/2009  . HYPERCHOLESTEROLEMIA 12/27/2009  . SMOKER 04/25/2009    History reviewed. No pertinent past surgical history.  Family History  Problem Relation Age of Onset  . Diabetes Mother   . Diabetes Father     History  Substance Use Topics  . Smoking status: Current Everyday Smoker -- 1.5 packs/day  . Smokeless tobacco: Not on file  . Alcohol Use: Yes     occ      Review of Systems  Constitutional: Negative for fever and fatigue.  HENT: Negative for neck pain.   Respiratory: Negative for shortness of breath.   Cardiovascular: Negative for chest pain and palpitations.  Genitourinary: Negative for dysuria, hematuria and flank pain.  Musculoskeletal: Positive for arthralgias. Negative for myalgias, back pain and joint swelling.  Skin: Negative for rash.    Neurological: Negative for dizziness, weakness and numbness.  Hematological: Does not bruise/bleed easily.  All other systems reviewed and are negative.    Allergies  Review of patient's allergies indicates no known allergies.  Home Medications   Current Outpatient Rx  Name Route Sig Dispense Refill  . ACCU-CHEK AVIVA PLUS VI STRP  USE 5 TIMES A DAY 540 strip 2  . GLUCAGON (RDNA) 1 MG IJ KIT  As directed     . INSULIN ASPART PROT & ASPART (70-30) 100 UNIT/ML Center SUSP Subcutaneous Inject into the skin. 25 units with breakfast, and 40 with evening meal, and pen needles 2/day.    Marland Kitchen. NAPROXEN 500 MG PO TABS Oral Take 1 tablet (500 mg total) by mouth 2 (two) times daily. 20 tablet 0  . OXYCODONE-ACETAMINOPHEN 5-325 MG PO TABS Oral Take 1 tablet by mouth every 4 (four) hours as needed for pain. 24 tablet 0    BP 116/71  Pulse 85  Temp(Src) 98.5 F (36.9 C) (Oral)  Resp 20  Ht 6' (1.829 m)  Wt 180 lb (81.647 kg)  BMI 24.41 kg/m2  SpO2 99%  Physical Exam  Nursing note and vitals reviewed. Constitutional: He is oriented to person, place, and time. He appears well-developed and well-nourished. No distress.  HENT:  Head: Normocephalic and atraumatic.  Neck: Normal range of motion. Neck supple.  Cardiovascular: Normal rate, regular rhythm and normal heart sounds.   Pulmonary/Chest: Effort normal and breath sounds normal. No respiratory distress.  Musculoskeletal: Normal range of motion. He exhibits tenderness. He exhibits no  edema.       Left knee: He exhibits normal range of motion, no swelling, no effusion, no ecchymosis, no deformity, no laceration, no erythema and no bony tenderness. tenderness found. Lateral joint line tenderness noted. No patellar tendon tenderness noted.       Legs: Lymphadenopathy:    He has no cervical adenopathy.  Neurological: He is alert and oriented to person, place, and time. He exhibits normal muscle tone. Coordination normal.  Skin: Skin is warm and  dry.    ED Course  Procedures (including critical care time)  Labs Reviewed - No data to display No results found.   1. Lower leg, muscle or tendon injury       MDM    Pt has ttp of the lateral left knee.  rom is preserved.  No effusion or edema.  No deformity or step-offs.likely ligament injury.   Pt agrees to close f/u with orthopedics.    Patient / Family / Caregiver understand and agree with initial ED impression and plan with expectations set for ED visit. Pt stable in ED with no significant deterioration in condition.       Gabriellia Rempel L. [place], [place] 03/13/11 2127

## 2011-03-15 NOTE — ED Provider Notes (Signed)
Medical screening examination/treatment/procedure(s) were performed by non-physician practitioner and as supervising physician I was immediately available for consultation/collaboration.   Geoffery Lyons, MD 03/15/11 [phone]

## 2011-07-13 ENCOUNTER — Telehealth (HOSPITAL_COMMUNITY): Payer: Self-pay | Admitting: Dietician

## 2011-07-13 NOTE — Telephone Encounter (Signed)
Received referral from Dr. Ouida Sills for dx: diabetes via fax on 07/12/11.

## 2011-07-19 NOTE — Telephone Encounter (Signed)
Sent letter to pt home via [employer] Mail in attempt to contact pt to schedule appointment.  

## 2011-07-26 NOTE — Telephone Encounter (Signed)
Sent letter to pt home via [employer] Mail in attempt to contact pt to schedule appointment.  

## 2011-07-31 NOTE — Telephone Encounter (Signed)
Pt has not responded to attempts to contact to schedule appointment. Referral filed.  

## 2011-09-07 ENCOUNTER — Telehealth (HOSPITAL_COMMUNITY): Payer: Self-pay | Admitting: Dietician

## 2011-09-07 NOTE — Telephone Encounter (Signed)
Appointment scheduled for 09/10/11 at 9:00 AM.

## 2011-09-10 ENCOUNTER — Encounter (HOSPITAL_COMMUNITY): Payer: Self-pay | Admitting: Dietician

## 2011-09-10 NOTE — Progress Notes (Signed)
Follow-Up Outpatient Nutrition Note Date: 09/10/2011  Appt Start Time: 8:54 AM  Nutrition Assessment:  Current weight: Weight: 174 lb (78.926 kg)  BMI: Body mass index is 23.60 kg/(m^2).  Weight changes: +10# (6%) x 1 year  Ronald Wilkinson last appointment with RD was 09/06/10. Noted improvement in Hgb A1c, but still not optimally controlled (A1c: 9.8 on 09/06/10 vs A1c: 7.9 on 07/14/11). Pt denies any significant lifestyle changes. He has decreased eating out from 3-4 times per week to 1-2 times per week. He drinks low calorie beverages. He reports his main problems are late night snacking and eating candy bars for snack during the day. Upon further questioning, pt reports that he is on sliding scale insulin and he is afraid of hypoglycemia. He reports CBGS between 40-60 in the afternoons (CBGs usually run between 200-250 throughout the rest of the day (pt checks 5 times daily). He reported fasting CBGs was 227 this AM.) He reports that he eats the candy bar to raise his blood sugar and to tide him over until he gets home from work. He reveals that his job is very demanding and he never knows when he is going to get off, and he is often called back in after his shift to resolve a problem. He also reports he occasionally will not take his HS Insulin if he is afraid that he will experience hypoglycemia in the morning.  He reports he snacks in front of the TV late at night.   Labs: CMP     Component Value Date/Time   NA 136 12/27/2009 0818   K 4.5 12/27/2009 0818   CL 104 12/27/2009 0818   CO2 25 12/27/2009 0818   GLUCOSE 317* 12/27/2009 0818   BUN 11 12/27/2009 0818   CREATININE 0.7 12/27/2009 0818   CALCIUM 9.0 12/27/2009 0818   GFRNONAA 129.34 12/27/2009 0818    Lipid Panel     Component Value Date/Time   CHOL 188 12/27/2009 0818   TRIG 85.0 12/27/2009 0818   HDL 34.60* 12/27/2009 0818   CHOLHDL 5 12/27/2009 0818   VLDL 17.0 12/27/2009 0818   LDLCALC 136* 12/27/2009 0818     Lab Results    Component Value Date   HGBA1C 9.8* 04/21/2010   HGBA1C 10.0* 12/27/2009   HGBA1C 9.6* 09/30/2009   Lab Results  Component Value Date   LDLCALC 136* 12/27/2009   CREATININE 0.7 12/27/2009     Diet recall: Breakfast (9:30 AM): bacon and egg sandwich on white wheat bread, tea with spelnda, potato chips; Lunch (12:00 PM): sandwich on white wheat bread, individual bag Lays potato chips, tea with splenda; Snack (2:30 PM): candy bar (snickers) with diet Pepsi; Dinner (5:30-6 PM): pasta salad, biscuit with tomato slice; Snack (8:30-9:00 PM): banana sandwich, coke zero OR leftovers from supper  Nutrition Diagnosis: Excessive carbohydrate intake r/t excessive snacking continues  Nutrition Intervention: Nutrition rx: 1800 kcal NAS, diabetic diet; 3 meals per day (60-75 grams carbohydrate per meal); and 2 snacks (1 PM, 1 HS) (15-30 grams carbohydrate per snack); low calorie beverages only  Education/ counseling provided: Reviewed diabetic diet principles with pt and pt wife. Discussed importance of regular meal schedule, particularly when on Insulin. Discussed healthier options for snacks, such as fruit or peanut butter or crackers. Discussed ways to prevent hypoglycemia and action plan to treat hypoglycemia (drink 4 oz of sugary beverage and then check glucose again to ensure rise in blood sugar). Discussed importance of taking HS insulin and eating an HS snack to  prevent AM hypoglycemia. Discussed importance of portion control and not eating in front of the television to become more aware of amount of food being consumed.   Understanding/Motivation/ Ability to follow recommendations: Expect fair compliance.   Monitoring and Evaluation: Goals for next visit: 1) Weight maintenance; 2) Hgb A1c < 7.0  Recommendations: 1) Eat HS snack with HS Insulin to prevent hypoglycemia; 2) Continue to monitor blood sugar closely; 3) Do not eat in front of the television   F/U: PRN. Provided RD contact information.    Melody Haver, RD, LDN Date:09/10/2011 Appt EndTime: 9:35 AM

## 2012-08-28 ENCOUNTER — Encounter (INDEPENDENT_AMBULATORY_CARE_PROVIDER_SITE_OTHER): Payer: Self-pay | Admitting: *Deleted

## 2012-09-04 ENCOUNTER — Encounter (INDEPENDENT_AMBULATORY_CARE_PROVIDER_SITE_OTHER): Payer: Self-pay | Admitting: *Deleted

## 2012-09-04 ENCOUNTER — Other Ambulatory Visit (INDEPENDENT_AMBULATORY_CARE_PROVIDER_SITE_OTHER): Payer: Self-pay | Admitting: *Deleted

## 2012-09-04 DIAGNOSIS — Z1211 Encounter for screening for malignant neoplasm of colon: Secondary | ICD-10-CM

## 2012-09-04 DIAGNOSIS — Z800 Family history of malignant neoplasm of digestive organs: Secondary | ICD-10-CM

## 2012-09-10 ENCOUNTER — Telehealth (INDEPENDENT_AMBULATORY_CARE_PROVIDER_SITE_OTHER): Payer: Self-pay | Admitting: *Deleted

## 2012-09-10 DIAGNOSIS — Z1211 Encounter for screening for malignant neoplasm of colon: Secondary | ICD-10-CM

## 2012-09-10 NOTE — Telephone Encounter (Addendum)
Patient needs movi prep -- nkda 

## 2012-09-12 MED ORDER — PEG-KCL-NACL-NASULF-NA ASC-C 100 G PO SOLR: 1.0000 | Freq: Once | ORAL | Status: DC

## 2012-10-08 ENCOUNTER — Telehealth (INDEPENDENT_AMBULATORY_CARE_PROVIDER_SITE_OTHER): Payer: Self-pay | Admitting: *Deleted

## 2012-10-08 NOTE — Telephone Encounter (Signed)
  Procedure: tcs  Reason/Indication:  Screening, fam hx colon ca  Has patient had this procedure before?  no  If so, when, by whom and where?    Is there a family history of colon cancer?  Yes, brother  Who?  What age when diagnosed?    Is patient diabetic?   yes      Does patient have prosthetic heart valve?  no  Do you have a pacemaker?  no  Has patient ever had endocarditis? no  Has patient had joint replacement within last 12 months?  no  Is patient on Coumadin, Plavix and/or Aspirin? yes  Medications: asa 81 mg daily, simvastatin 20 mg daily, multi vit daily, vit d 5000 1 tab every other day, humolog, lantus 34 units @ bedtime, ibuprofen prn  Allergies: nkda  Medication Adjustment: asa 2 days, decreae lantus to 20 units, no change to Humolog  Procedure date & time: 10/29/12 at 730

## 2012-10-09 NOTE — Telephone Encounter (Signed)
agree

## 2012-10-14 ENCOUNTER — Encounter (HOSPITAL_COMMUNITY): Payer: Self-pay | Admitting: Pharmacy Technician

## 2012-10-15 ENCOUNTER — Encounter (HOSPITAL_COMMUNITY): Payer: Self-pay | Admitting: Pharmacy Technician

## 2012-10-29 ENCOUNTER — Ambulatory Visit (HOSPITAL_COMMUNITY)
Admission: RE | Admit: 2012-10-29 | Discharge: 2012-10-29 | Disposition: A | Discharge status: Home / Self Care | Payer: BC Managed Care – PPO | Source: Ambulatory Visit | Attending: Internal Medicine | Admitting: Internal Medicine

## 2012-10-29 ENCOUNTER — Encounter (HOSPITAL_COMMUNITY): Payer: Self-pay

## 2012-10-29 ENCOUNTER — Encounter (HOSPITAL_COMMUNITY)
Admission: RE | Disposition: A | Discharge status: Home / Self Care | Payer: Self-pay | Source: Ambulatory Visit | Attending: Internal Medicine

## 2012-10-29 DIAGNOSIS — K6389 Other specified diseases of intestine: Secondary | ICD-10-CM | POA: Insufficient documentation

## 2012-10-29 DIAGNOSIS — Z800 Family history of malignant neoplasm of digestive organs: Secondary | ICD-10-CM | POA: Insufficient documentation

## 2012-10-29 DIAGNOSIS — Z01812 Encounter for preprocedural laboratory examination: Secondary | ICD-10-CM | POA: Insufficient documentation

## 2012-10-29 DIAGNOSIS — K644 Residual hemorrhoidal skin tags: Secondary | ICD-10-CM | POA: Insufficient documentation

## 2012-10-29 DIAGNOSIS — E119 Type 2 diabetes mellitus without complications: Secondary | ICD-10-CM | POA: Insufficient documentation

## 2012-10-29 DIAGNOSIS — Z1211 Encounter for screening for malignant neoplasm of colon: Secondary | ICD-10-CM | POA: Insufficient documentation

## 2012-10-29 HISTORY — PX: COLONOSCOPY: SHX5424

## 2012-10-29 LAB — GLUCOSE, CAPILLARY: Glucose-Capillary: 216 mg/dL — ABNORMAL HIGH (ref 70–99)

## 2012-10-29 SURGERY — COLONOSCOPY
Anesthesia: Moderate Sedation

## 2012-10-29 MED ORDER — MEPERIDINE HCL 50 MG/ML IJ SOLN
INTRAMUSCULAR | Status: DC | PRN
  Administered 2012-10-29 (×2): 25 mg via INTRAVENOUS

## 2012-10-29 MED ORDER — MIDAZOLAM HCL 5 MG/5ML IJ SOLN
INTRAMUSCULAR | Status: AC
  Filled 2012-10-29: qty 5

## 2012-10-29 MED ORDER — STERILE WATER FOR IRRIGATION IR SOLN
Status: DC | PRN
  Administered 2012-10-29: 08:00:00

## 2012-10-29 MED ORDER — MEPERIDINE HCL 50 MG/ML IJ SOLN
INTRAMUSCULAR | Status: AC
  Filled 2012-10-29: qty 1

## 2012-10-29 MED ORDER — MIDAZOLAM HCL 5 MG/5ML IJ SOLN
INTRAMUSCULAR | Status: DC | PRN
Start: 2012-10-29 — End: 2012-10-29
  Administered 2012-10-29 (×2): 2 mg via INTRAVENOUS
  Administered 2012-10-29: 1 mg via INTRAVENOUS

## 2012-10-29 NOTE — Discharge Instructions (Signed)
Resume usual medications and diet. No driving for 24 hours. Next screening exam in 5 years. Colonoscopy Care After Read the instructions outlined below and refer to this sheet in the next few weeks. These discharge instructions provide you with general information on caring for yourself after you leave the hospital. Your doctor may also give you specific instructions. While your treatment has been planned according to the most current medical practices available, unavoidable complications occasionally occur. If you have any problems or questions after discharge, call your doctor. HOME CARE INSTRUCTIONS ACTIVITY:  You may resume your regular activity, but move at a slower pace for the next 24 hours.  Take frequent rest periods for the next 24 hours.  Walking will help get rid of the air and reduce the bloated feeling in your belly (abdomen).  No driving for 24 hours (because of the medicine (anesthesia) used during the test).  You may shower.  Do not sign any important legal documents or operate any machinery for 24 hours (because of the anesthesia used during the test). NUTRITION:  Drink plenty of fluids.  You may resume your normal diet as instructed by your doctor.  Begin with a light meal and progress to your normal diet. Heavy or fried foods are harder to digest and may make you feel sick to your stomach (nauseated).  Avoid alcoholic beverages for 24 hours or as instructed. MEDICATIONS:  You may resume your normal medications unless your doctor tells you otherwise. WHAT TO EXPECT TODAY:  Some feelings of bloating in the abdomen.  Passage of more gas than usual.  Spotting of blood in your stool or on the toilet paper. IF YOU HAD POLYPS REMOVED DURING THE COLONOSCOPY:  No aspirin products for 7 days or as instructed.  No alcohol for 7 days or as instructed.  Eat a soft diet for the next 24 hours. FINDING OUT THE RESULTS OF YOUR TEST Not all test results are available  during your visit. If your test results are not back during the visit, make an appointment with your caregiver to find out the results. Do not assume everything is normal if you have not heard from your caregiver or the medical facility. It is important for you to follow up on all of your test results.  SEEK IMMEDIATE MEDICAL CARE IF:  You have more than a spotting of blood in your stool.  Your belly is swollen (abdominal distention).  You are nauseated or vomiting.  You have a fever.  You have abdominal pain or discomfort that is severe or gets worse throughout the day. Document Released: 08/30/2003 Document Revised: 04/09/2011 Document Reviewed: 08/28/2007 [facility] Patient Information 2014 [place], [place].

## 2012-10-29 NOTE — H&P (Signed)
Ronald Wilkinson is an 52 y.o. male.   Chief Complaint: Patient is here for colonoscopy. HPI: Patient is 52 year old Caucasian male who is in for screening colonoscopy. This is patient's first exam. He denies abdominal pain change in bowel habits or rectal bleeding. Family history is significant for colon carcinoma in his brother who was diagnosed at age 66 and metastases 2 years later.  Past Medical History  Diagnosis Date  . IDDM 04/25/2009  . HYPERCHOLESTEROLEMIA 12/27/2009  . SMOKER 04/25/2009    Past Surgical History  Procedure Laterality Date  . Open reduction internal fixation (orif) hand Right     Family History  Problem Relation Age of Onset  . Diabetes Mother   . Diabetes Father    Social History:  reports that he has been smoking Cigarettes.  He has a 61.5 pack-year smoking history. He does not have any smokeless tobacco history on file. He reports that  drinks alcohol. He reports that he does not use illicit drugs.  Allergies: No Known Allergies  Medications Prior to Admission  Medication Sig Dispense Refill  . aspirin EC 81 MG tablet Take 81 mg by mouth daily.      . Cholecalciferol (VITAMIN D-3) 5000 UNITS TABS Take 5,000 Units by mouth daily.      Marland Kitchen ibuprofen (ADVIL,MOTRIN) 200 MG tablet Take 800 mg by mouth daily.      . insulin aspart protamine-insulin aspart (NOVOLOG MIX 70/30 FLEXPEN) (70-30) 100 UNIT/ML injection Inject 6-12 Units into the skin 3 (three) times daily. According to a sliding scale at home      . insulin glargine (LANTUS) 100 UNIT/ML injection Inject 34 Units into the skin at bedtime.       . Multiple Vitamin (MULTIVITAMIN WITH MINERALS) TABS tablet Take 1 tablet by mouth daily.      . peg 3350 powder (MOVIPREP) 100 G SOLR Take 1 kit (200 g total) by mouth once.  1 kit  0  . simvastatin (ZOCOR) 20 MG tablet Take 20 mg by mouth every evening.        Results for orders placed during the hospital encounter of 10/29/12 (from the past 48 hour(s))   GLUCOSE, CAPILLARY     Status: Abnormal   Collection Time    10/29/12  7:06 AM      Result Value Range   Glucose-Capillary 216 (*) 70 - 99 mg/dL   No results found.  ROS  Blood pressure 126/86, pulse 71, temperature 98 F (36.7 C), temperature source Oral, resp. rate 15, height 6' (1.829 m), weight 174 lb (78.926 kg), SpO2 12.00%. Physical Exam  Constitutional: He appears well-developed and well-nourished.  HENT:  Mouth/Throat: Oropharynx is clear and moist.  Eyes: Conjunctivae are normal. No scleral icterus.  Neck: No thyromegaly present.  Cardiovascular: Normal rate, regular rhythm and normal heart sounds.   No murmur heard. Respiratory: Effort normal and breath sounds normal.  GI: Soft. He exhibits no distension and no mass. There is no tenderness.  Musculoskeletal: He exhibits no edema.  Lymphadenopathy:    He has no cervical adenopathy.  Neurological: He is alert.  Skin: Skin is warm and dry.     Assessment/Plan High risk screening colonoscopy.  Ronald Wilkinson U 10/29/2012, 7:33 AM

## 2012-10-29 NOTE — Op Note (Signed)
COLONOSCOPY PROCEDURE REPORT  PATIENT:  Ronald Wilkinson  MR#:  [id number] Birthdate:  06-17-1960, 52 y.o., male Endoscopist:  Dr. Malissa Hippo, MD Referred By:  Dr. Carylon Perches, MD  Procedure Date: 10/29/2012  Procedure:   Colonoscopy  Indications:  Patient is 52 year old Caucasian male who is undergoing high risk screening colonoscopy. This is patient's first exam. Family history significant for colon carcinoma in his father who is 64 at the time of diagnosis and died of mets at 59.  Informed Consent:  The procedure and risks were reviewed with the patient and informed consent was obtained.  Medications:  Demerol 50 mg IV Versed 5 mg IV  Description of procedure:  After a digital rectal exam was performed, that colonoscope was advanced from the anus through the rectum and colon to the area of the cecum, ileocecal valve and appendiceal orifice. The cecum was deeply intubated. These structures were well-seen and photographed for the record. From the level of the cecum and ileocecal valve, the scope was slowly and cautiously withdrawn. The mucosal surfaces were carefully surveyed utilizing scope tip to flexion to facilitate fold flattening as needed. The scope was pulled down into the rectum where a thorough exam including retroflexion was performed.  Findings:   Prep satisfactory. Mild mucosal pigmentation in the region of hepatic flexure. Normal rectal mucosa. Small hemorrhoids below the dentate line.   Therapeutic/Diagnostic Maneuvers Performed:  None  Complications:  None  Cecal Withdrawal Time:  9 minutes  Impression:  Examination performed to cecum. Mild melanosis coli in the region of hepatic flexure. Small external hemorrhoids. No evidence of colonic polyps or diverticulosis.  Recommendations:  Standard instructions given. Next screening exam in 5 years.  Dulce Martian U  10/29/2012 8:05 AM  CC: Dr. Carylon Perches, MD & Dr. Bonnetta Barry ref. provider found

## 2012-10-31 ENCOUNTER — Encounter (HOSPITAL_COMMUNITY): Payer: Self-pay | Admitting: Internal Medicine

## 2015-05-26 ENCOUNTER — Emergency Department (HOSPITAL_COMMUNITY)
Admission: EM | Admit: 2015-05-26 | Discharge: 2015-05-26 | Disposition: A | Discharge status: Home / Self Care | Payer: BLUE CROSS/BLUE SHIELD | Attending: Emergency Medicine | Admitting: Emergency Medicine

## 2015-05-26 ENCOUNTER — Emergency Department (HOSPITAL_COMMUNITY): Payer: BLUE CROSS/BLUE SHIELD

## 2015-05-26 ENCOUNTER — Encounter (HOSPITAL_COMMUNITY): Payer: Self-pay | Admitting: Emergency Medicine

## 2015-05-26 DIAGNOSIS — E1065 Type 1 diabetes mellitus with hyperglycemia: Secondary | ICD-10-CM | POA: Diagnosis not present

## 2015-05-26 DIAGNOSIS — B349 Viral infection, unspecified: Secondary | ICD-10-CM

## 2015-05-26 DIAGNOSIS — Z791 Long term (current) use of non-steroidal anti-inflammatories (NSAID): Secondary | ICD-10-CM | POA: Diagnosis not present

## 2015-05-26 DIAGNOSIS — F17210 Nicotine dependence, cigarettes, uncomplicated: Secondary | ICD-10-CM | POA: Insufficient documentation

## 2015-05-26 DIAGNOSIS — Z7982 Long term (current) use of aspirin: Secondary | ICD-10-CM | POA: Insufficient documentation

## 2015-05-26 DIAGNOSIS — R739 Hyperglycemia, unspecified: Secondary | ICD-10-CM

## 2015-05-26 DIAGNOSIS — Z794 Long term (current) use of insulin: Secondary | ICD-10-CM | POA: Diagnosis not present

## 2015-05-26 LAB — HEPATIC FUNCTION PANEL
ALT: 27 U/L (ref 17–63)
AST: 28 U/L (ref 15–41)
Albumin: 5.2 g/dL — ABNORMAL HIGH (ref 3.5–5.0)
Alkaline Phosphatase: 97 U/L (ref 38–126)
Bilirubin, Direct: 0.1 mg/dL (ref 0.1–0.5)
Indirect Bilirubin: 1.1 mg/dL — ABNORMAL HIGH (ref 0.3–0.9)
Total Bilirubin: 1.2 mg/dL (ref 0.3–1.2)
Total Protein: 8.1 g/dL (ref 6.5–8.1)

## 2015-05-26 LAB — URINALYSIS, ROUTINE W REFLEX MICROSCOPIC
Bilirubin Urine: NEGATIVE
Glucose, UA: >1000 mg/dL — AB
Hgb urine dipstick: NEGATIVE
Ketones, ur: 40 mg/dL — AB
Leukocytes, UA: NEGATIVE
Nitrite: NEGATIVE
Protein, ur: NEGATIVE mg/dL
Specific Gravity, Urine: 1.015 (ref 1.005–1.030)
pH: 5.5 (ref 5.0–8.0)

## 2015-05-26 LAB — BASIC METABOLIC PANEL
Anion gap: 18 — ABNORMAL HIGH (ref 5–15)
BUN: 20 mg/dL (ref 6–20)
CO2: 20 mmol/L — ABNORMAL LOW (ref 22–32)
Calcium: 9.8 mg/dL (ref 8.9–10.3)
Chloride: 95 mmol/L — ABNORMAL LOW (ref 101–111)
Creatinine, Ser: 0.94 mg/dL (ref 0.61–1.24)
GFR calc Af Amer: >60 mL/min (ref >60)
GFR calc non Af Amer: >60 mL/min (ref >60)
Glucose, Bld: 378 mg/dL — ABNORMAL HIGH (ref 65–99)
Potassium: 4.3 mmol/L (ref 3.5–5.1)
Sodium: 133 mmol/L — ABNORMAL LOW (ref 135–145)

## 2015-05-26 LAB — CBC WITH DIFFERENTIAL/PLATELET
Basophils Absolute: 0.1 10*3/uL (ref 0.0–0.1)
Basophils Relative: 0 %
Eosinophils Absolute: 0 10*3/uL (ref 0.0–0.7)
Eosinophils Relative: 0 %
HCT: 43.2 % (ref 39.0–52.0)
Hemoglobin: 15.1 g/dL (ref 13.0–17.0)
Lymphocytes Relative: 4 %
Lymphs Abs: 1.1 10*3/uL (ref 0.7–4.0)
MCH: 30.2 pg (ref 26.0–34.0)
MCHC: 35 g/dL (ref 30.0–36.0)
MCV: 86.4 fL (ref 78.0–100.0)
Monocytes Absolute: 2 10*3/uL — ABNORMAL HIGH (ref 0.1–1.0)
Monocytes Relative: 7 %
Neutro Abs: 24.1 10*3/uL — ABNORMAL HIGH (ref 1.7–7.7)
Neutrophils Relative %: 89 %
Platelets: 369 10*3/uL (ref 150–400)
RBC: 5 MIL/uL (ref 4.22–5.81)
RDW: 13.1 % (ref 11.5–15.5)
WBC: 27.2 10*3/uL — ABNORMAL HIGH (ref 4.0–10.5)

## 2015-05-26 LAB — CBG MONITORING, ED
Glucose-Capillary: 349 mg/dL — ABNORMAL HIGH (ref 65–99)
Glucose-Capillary: 293 mg/dL — ABNORMAL HIGH (ref 65–99)

## 2015-05-26 LAB — TROPONIN I: Troponin I: <0.03 ng/mL (ref <0.031)

## 2015-05-26 LAB — URINE MICROSCOPIC-ADD ON
Bacteria, UA: NONE SEEN
RBC / HPF: NONE SEEN RBC/hpf (ref 0–5)
Squamous Epithelial / HPF: NONE SEEN
WBC, UA: NONE SEEN WBC/hpf (ref 0–5)

## 2015-05-26 MED ORDER — KETOROLAC TROMETHAMINE 30 MG/ML IJ SOLN
30.0000 mg | Freq: Once | INTRAMUSCULAR | Status: AC
  Administered 2015-05-26: 30 mg via INTRAVENOUS
  Filled 2015-05-26: qty 1

## 2015-05-26 MED ORDER — SODIUM CHLORIDE 0.9 % IV BOLUS (SEPSIS)
1000.0000 mL | Freq: Once | INTRAVENOUS | Status: AC
  Administered 2015-05-26: 1000 mL via INTRAVENOUS

## 2015-05-26 MED ORDER — OXYCODONE-ACETAMINOPHEN 5-325 MG PO TABS: 1.0000 | ORAL_TABLET | ORAL | Status: DC | PRN

## 2015-05-26 MED ORDER — FENTANYL CITRATE (PF) 100 MCG/2ML IJ SOLN
50.0000 ug | Freq: Once | INTRAMUSCULAR | Status: AC
  Administered 2015-05-26: 50 ug via INTRAVENOUS
  Filled 2015-05-26: qty 2

## 2015-05-26 MED ORDER — ONDANSETRON HCL 8 MG PO TABS: 8.0000 mg | ORAL_TABLET | ORAL | Status: DC | PRN

## 2015-05-26 MED ORDER — MORPHINE SULFATE (PF) 4 MG/ML IV SOLN
4.0000 mg | Freq: Once | INTRAVENOUS | Status: AC
  Administered 2015-05-26: 4 mg via INTRAVENOUS
  Filled 2015-05-26: qty 1

## 2015-05-26 MED ORDER — ONDANSETRON HCL 4 MG/2ML IJ SOLN
4.0000 mg | Freq: Once | INTRAMUSCULAR | Status: AC
  Administered 2015-05-26: 4 mg via INTRAVENOUS
  Filled 2015-05-26: qty 2

## 2015-05-26 NOTE — ED Notes (Signed)
Diabetic management nurse called to speak with pt regarding insulin omni pump

## 2015-05-26 NOTE — ED Provider Notes (Signed)
CSN: [id number]     Arrival date & time 05/26/15  4098 History  By signing my name below, I, Ronney Lion, attest that this documentation has been prepared under the direction and in the presence of Donnetta Hutching, MD. Electronically Signed: Ronney Lion, ED Scribe. 05/26/2015. 9:48 AM.   Chief Complaint  Patient presents with  . Hyperglycemia    The history is provided by the patient. No language interpreter was used.    HPI Comments: Ronald Wilkinson is a 55 y.o. male with a history of IDDM, who presents to the Emergency Department complaining of constant, moderate, 4 episodes of vomiting last night. He also complains of generalized myalgias across "shoulders, arms, and sides." This is a new problem. Patient also reports a history of DM. Patient reports he has a wireless insulin pump (Omnipod) that he has been using since June/July. Patient reports he checked his blood glucose this morning, which was elevated at 350.  He denies chest pain, SOB, or diaphoresis. Patient has NKDA.   Past Medical History  Diagnosis Date  . IDDM 04/25/2009  . HYPERCHOLESTEROLEMIA 12/27/2009  . SMOKER 04/25/2009   Past Surgical History  Procedure Laterality Date  . Open reduction internal fixation (orif) hand Right   . Colonoscopy N/A 10/29/2012    Procedure: COLONOSCOPY;  Surgeon: Malissa Hippo, MD;  Location: [facility];  Service: Endoscopy;  Laterality: N/A;  730   Family History  Problem Relation Age of Onset  . Diabetes Mother   . Diabetes Father    Social History  Substance Use Topics  . Smoking status: Current Every Day Smoker -- 0.50 packs/day for 41 years    Types: Cigarettes  . Smokeless tobacco: None  . Alcohol Use: Yes     Comment: occ    Review of Systems  Constitutional: Negative for diaphoresis.  Respiratory: Negative for shortness of breath.   Cardiovascular: Negative for chest pain.  Gastrointestinal: Positive for nausea and vomiting.  Musculoskeletal: Positive for myalgias.   All other systems reviewed and are negative.     Allergies  Review of patient's allergies indicates no known allergies.  Home Medications   Prior to Admission medications   Medication Sig Start Date End Date Taking? Authorizing Provider  aspirin EC 81 MG tablet Take 81 mg by mouth daily.   Yes Historical Provider, MD  Cholecalciferol (VITAMIN D-3) 5000 UNITS TABS Take 5,000 Units by mouth daily.   Yes Historical Provider, MD  ibuprofen (ADVIL,MOTRIN) 200 MG tablet Take 800 mg by mouth daily.   Yes Historical Provider, MD  insulin lispro (HUMALOG) 100 UNIT/ML cartridge Inject into the skin. Insulin pod   Yes Historical Provider, MD  Multiple Vitamin (MULTIVITAMIN WITH MINERALS) TABS tablet Take 1 tablet by mouth daily.   Yes Historical Provider, MD  simvastatin (ZOCOR) 20 MG tablet Take 20 mg by mouth every evening.   Yes Historical Provider, MD  ondansetron (ZOFRAN) 8 MG tablet Take 1 tablet (8 mg total) by mouth every 4 (four) hours as needed. 05/26/15   Donnetta Hutching, MD  oxyCODONE-acetaminophen (PERCOCET) 5-325 MG tablet Take 1-2 tablets by mouth every 4 (four) hours as needed. 05/26/15   Donnetta Hutching, MD   BP 140/92 mmHg  Pulse 100  Temp(Src) 97.9 F (36.6 C) (Oral)  Resp 20  Ht 6' (1.829 m)  Wt 178 lb (80.74 kg)  BMI 24.14 kg/m2  SpO2 100% Physical Exam  Constitutional: He is oriented to person, place, and time. He appears well-developed and well-nourished.  Dehydrated-appearing, but alert.  HENT:  Head: Normocephalic and atraumatic.  Eyes: Conjunctivae and EOM are normal. Pupils are equal, round, and reactive to light.  Neck: Normal range of motion. Neck supple.  Cardiovascular: Normal rate, regular rhythm and normal heart sounds.   Pulmonary/Chest: Effort normal and breath sounds normal.  Abdominal: Soft. Bowel sounds are normal. There is no tenderness.  Musculoskeletal: Normal range of motion.  Neurological: He is alert and oriented to person, place, and time.  Skin: Skin  is warm and dry.  Psychiatric: He has a normal mood and affect. His behavior is normal.  Nursing note and vitals reviewed.   ED Course  Procedures (including critical care time)  DIAGNOSTIC STUDIES: Oxygen Saturation is 100% on RA, normal by my interpretation.    COORDINATION OF CARE: 9:37 AM - Discussed treatment plan with pt at bedside which includes IV fluids, nausea medication, pain medications, and blood tests. Pt verbalized understanding and agreed to plan.   Labs Review Labs Reviewed  CBC WITH DIFFERENTIAL/PLATELET - Abnormal; Notable for the following:    WBC 27.2 (*)    Neutro Abs 24.1 (*)    Monocytes Absolute 2.0 (*)    All other components within normal limits  BASIC METABOLIC PANEL - Abnormal; Notable for the following:    Sodium 133 (*)    Chloride 95 (*)    CO2 20 (*)    Glucose, Bld 378 (*)    Anion gap 18 (*)    All other components within normal limits  URINALYSIS, ROUTINE W REFLEX MICROSCOPIC (NOT [facility]) - Abnormal; Notable for the following:    Glucose, UA >1000 (*)    Ketones, ur 40 (*)    All other components within normal limits  HEPATIC FUNCTION PANEL - Abnormal; Notable for the following:    Albumin 5.2 (*)    Indirect Bilirubin 1.1 (*)    All other components within normal limits  CBG MONITORING, ED - Abnormal; Notable for the following:    Glucose-Capillary 349 (*)    All other components within normal limits  CBG MONITORING, ED - Abnormal; Notable for the following:    Glucose-Capillary 293 (*)    All other components within normal limits  TROPONIN I  URINE MICROSCOPIC-ADD ON    Imaging Review Dg Chest 2 View  05/26/2015  CLINICAL DATA:  Weakness, elevated glucose this morning, smoker EXAM: CHEST  2 VIEW COMPARISON:  02/21/2007 FINDINGS: Cardiomediastinal silhouette is stable. Mild hyperinflation. No acute infiltrate or pleural effusion. No pulmonary edema. Bony thorax is stable. IMPRESSION: No active disease.  Mild hyperinflation.  Electronically Signed   By: Natasha MeadLiviu  Pop M.D.   On: 05/26/2015 10:34   I have personally reviewed and evaluated these images and lab results as part of my medical decision-making.   EKG Interpretation   Date/Time:  Thursday May 26 2015 10:05:14 EDT Ventricular Rate:  72 PR Interval:  153 QRS Duration: 116 QT Interval:  420 QTC Calculation: 460 R Axis:   92 Text Interpretation:  Sinus arrhythmia Nonspecific intraventricular  conduction delay Minimal ST depression, inferior leads Baseline wander in  lead(s) V5 V6 Confirmed by Ellyn Rubiano  MD, Deontrey Massi ([id number]) on 05/26/2015 10:35:10  AM      MDM   Final diagnoses:  Hyperglycemia  Viral syndrome   Patient feels much better after 2 L of IV fluid. Lab work suggests a very mild DKA. Patient prefers to go home and hydrate himself further. He is ambulatory and nontoxic-appearing at discharge. Discussed  findings with the patient and his wife  I personally performed the services described in this documentation, which was scribed in my presence. The recorded information has been reviewed and is accurate.      Donnetta Hutching, MD 05/27/15 [phone]

## 2015-05-26 NOTE — Discharge Instructions (Signed)
Medication for pain and nausea.  Increase fluids. Can also take ibuprofen. The prescription pain medication has Tylenol in it.     You may have very mild DKA, but you should be able to be treated at home. However, if you get worse, return here for admission.

## 2015-05-26 NOTE — ED Notes (Signed)
Pt very vague with symptoms.  States he hurts all over.  Glucose was elevated this morning at around 350.  States he has an "insulin pod" to manage his sugar and is not sure how much he got today.

## 2015-05-26 NOTE — ED Notes (Signed)
Pt c/o generalized body aches and vomiting since midnight. Denies diarrhea. [id number].

## 2015-05-26 NOTE — ED Notes (Addendum)
CBG obtained 293

## 2015-05-26 NOTE — Progress Notes (Signed)
Inpatient Diabetes Program Recommendations  AACE/ADA: New Consensus Statement on Inpatient Glycemic Control (2015)  Target Ranges:  Prepandial:   less than 140 mg/dL      Peak postprandial:   less than 180 mg/dL (1-2 hours)      Critically ill patients:  140 - 180 mg/dL   Review of Glycemic Control:  Results for Ronald Wilkinson, Ronald Wilkinson (MRN [id number]) as of 05/26/2015 12:25  Ref. Range 05/26/2015 09:05  Glucose-Capillary Latest Ref Range: 65-99 mg/dL [id number] (Wilkinson)   Diabetes history: Type 2 diabetes Outpatient Diabetes medications: Humalog via Omnipod insulin pump Current orders for Inpatient glycemic control:  Insulin pump  Inpatient Diabetes Program Recommendations:    Spoke with patient by phone.  He currently has his Omnipod on his Right arm.  He states that blood sugar was elevated this morning and he bolused in the ED for a blood sugar of 349 mg/dL.  Wife rechecked around 11:30 a and blood sugar was down to 290 mg/dL per his personal meter.  Patient states taIf patient is admitted he will need to have wife bring his insulin pump supplies so that site can be changed this evening.  Discussed with RN and asked for blood sugars to be checked every 2-4 hours while in the ED.  Patient can bolus himself with insulin from insulin pump. He see's Dr. Leslie Wilkinson.    Will follow.  Thanks, Ronald MeagerJenny Margi Edmundson, RN, BC-ADM Inpatient Diabetes Coordinator Pager [phone] (8a-5p)

## 2016-06-26 ENCOUNTER — Ambulatory Visit (INDEPENDENT_AMBULATORY_CARE_PROVIDER_SITE_OTHER): Payer: BLUE CROSS/BLUE SHIELD | Admitting: General Surgery

## 2016-06-26 ENCOUNTER — Encounter: Payer: Self-pay | Admitting: General Surgery

## 2016-06-26 VITALS — BP 138/75 | HR 78 | Temp 98.2°F | Resp 18 | Ht 72.0 in | Wt 173.0 lb

## 2016-06-26 DIAGNOSIS — L723 Sebaceous cyst: Secondary | ICD-10-CM | POA: Diagnosis not present

## 2016-06-26 NOTE — Progress Notes (Signed)
Ronald Wilkinson; [id number]; January 28, 1961   HPI Patient is a 56 year old white male who was referred to my care by Dr. Ouida Sills for evaluation treatment of a sebaceous cyst on his chest. Dr. Ouida Sills had treated this in his office with expression of the contents and antibiotics. Patient states that now there is no drainage and no smell. He denies any pain in this region. No fever chills have been noted. This is the first time he has had drainage from this cyst. Past Medical History:  Diagnosis Date  . HYPERCHOLESTEROLEMIA 12/27/2009  . IDDM 04/25/2009  . SMOKER 04/25/2009    Past Surgical History:  Procedure Laterality Date  . COLONOSCOPY N/A 10/29/2012   Procedure: COLONOSCOPY;  Surgeon: Malissa Hippo, MD;  Location: [facility];  Service: Endoscopy;  Laterality: N/A;  730  . OPEN REDUCTION INTERNAL FIXATION (ORIF) HAND Right     Family History  Problem Relation Age of Onset  . Diabetes Mother   . Diabetes Father     Current Outpatient Prescriptions on File Prior to Visit  Medication Sig Dispense Refill  . aspirin EC 81 MG tablet Take 81 mg by mouth daily.    . Cholecalciferol (VITAMIN D-3) 5000 UNITS TABS Take 5,000 Units by mouth daily.    Marland Kitchen ibuprofen (ADVIL,MOTRIN) 200 MG tablet Take 800 mg by mouth daily.    . insulin lispro (HUMALOG) 100 UNIT/ML cartridge Inject into the skin. Insulin pod    . Multiple Vitamin (MULTIVITAMIN WITH MINERALS) TABS tablet Take 1 tablet by mouth daily.    . ondansetron (ZOFRAN) 8 MG tablet Take 1 tablet (8 mg total) by mouth every 4 (four) hours as needed. 10 tablet 0  . oxyCODONE-acetaminophen (PERCOCET) 5-325 MG tablet Take 1-2 tablets by mouth every 4 (four) hours as needed. 20 tablet 0  . simvastatin (ZOCOR) 20 MG tablet Take 20 mg by mouth every evening.     No current facility-administered medications on file prior to visit.     No Known Allergies  History  Alcohol Use  . Yes    Comment: occ    History  Smoking Status  . Current  Every Day Smoker  . Packs/day: 0.50  . Years: 41.00  . Types: Cigarettes  Smokeless Tobacco  . Never Used    Review of Systems  Constitutional: Negative.   HENT: Negative.   Eyes: Negative.   Respiratory: Negative.   Cardiovascular: Negative.   Gastrointestinal: Negative.   Genitourinary: Negative.   Musculoskeletal: Negative.   Skin: Negative.   Neurological: Negative.   Endo/Heme/Allergies: Negative.   Psychiatric/Behavioral: Negative.     Objective   Vitals:   06/26/16 0939  BP: 138/75  Pulse: 78  Resp: 18  Temp: 98.2 F (36.8 C)    Physical Exam  Constitutional: He is oriented to person, place, and time and well-developed, well-nourished, and in no distress.  HENT:  Head: Normocephalic and atraumatic.  Cardiovascular: Normal rate, regular rhythm and normal heart sounds.   No murmur heard. Pulmonary/Chest: Effort normal and breath sounds normal. He has no wheezes. He has no rales.  Neurological: He is alert and oriented to person, place, and time.  Skin:  Residual punctum noted in left upper anterior chest skin. No induration or erythema noted. I was unable to express any sebum from the cyst.  Vitals reviewed.   Assessment  Sebaceous cyst, skin, chest Plan   As there appears to be no residual cyst contents present, no need for further excision at this  time. Patient was instructed to return to my office should the cyst start becoming inflamed and draining. He understands and agrees. Follow-up as needed.

## 2016-06-26 NOTE — Patient Instructions (Signed)
Epidermal Cyst An epidermal cyst is a small, painless lump under your skin. It may be called an epidermal inclusion cyst or an infundibular cyst. The cyst contains a grayish-white, bad-smelling substance (keratin). It is important not to pop epidermal cysts yourself. These cysts are usually harmless (benign), but they can get infected. Symptoms of infection may include:  Redness.  Inflammation.  Tenderness.  Warmth.  Fever.  A grayish-white, bad-smelling substance draining from the cyst.  Pus draining from the cyst. Follow these instructions at home:  Take over-the-counter and prescription medicines only as told by your doctor.  If you were prescribed an antibiotic, use it as told by your doctor. Do not stop using the antibiotic even if you start to feel better.  Keep the area around your cyst clean and dry.  Wear loose, dry clothing.  Do not try to pop your cyst.  Avoid touching your cyst.  Check your cyst every day for signs of infection.  Keep all follow-up visits as told by your doctor. This is important. How is this prevented?  Wear clean, dry, clothing.  Avoid wearing tight clothing.  Keep your skin clean and dry. Shower or take baths every day.  Wash your body with a benzoyl peroxide wash when you shower or bathe. Contact a health care provider if:  Your cyst has symptoms of infection.  Your condition is not improving or is getting worse.  You have a cyst that looks different from other cysts you have had.  You have a fever. Get help right away if:  Redness spreads from the cyst into the surrounding area. This information is not intended to replace advice given to you by your health care provider. Make sure you discuss any questions you have with your health care provider. Document Released: 02/23/2004 Document Revised: 09/14/2015 Document Reviewed: 11/17/2014 Elsevier Interactive Patient Education  2017 [employer]  

## 2017-10-22 ENCOUNTER — Encounter (INDEPENDENT_AMBULATORY_CARE_PROVIDER_SITE_OTHER): Payer: Self-pay | Admitting: *Deleted

## 2017-11-18 ENCOUNTER — Encounter (INDEPENDENT_AMBULATORY_CARE_PROVIDER_SITE_OTHER): Payer: Self-pay | Admitting: *Deleted

## 2018-03-05 ENCOUNTER — Other Ambulatory Visit (INDEPENDENT_AMBULATORY_CARE_PROVIDER_SITE_OTHER): Payer: Self-pay | Admitting: *Deleted

## 2018-03-05 DIAGNOSIS — Z800 Family history of malignant neoplasm of digestive organs: Secondary | ICD-10-CM | POA: Insufficient documentation

## 2018-04-29 ENCOUNTER — Encounter (INDEPENDENT_AMBULATORY_CARE_PROVIDER_SITE_OTHER): Payer: Self-pay | Admitting: *Deleted

## 2018-04-29 ENCOUNTER — Telehealth (INDEPENDENT_AMBULATORY_CARE_PROVIDER_SITE_OTHER): Payer: Self-pay | Admitting: *Deleted

## 2018-04-29 MED ORDER — PEG-KCL-NACL-NASULF-NA ASC-C 140 G PO SOLR: 1.0000 | Freq: Once | ORAL | 0 refills | Status: AC

## 2018-04-29 NOTE — Telephone Encounter (Signed)
Patient needs plenvu 

## 2018-06-19 DIAGNOSIS — Z800 Family history of malignant neoplasm of digestive organs: Principal | ICD-10-CM

## 2018-09-08 ENCOUNTER — Encounter (INDEPENDENT_AMBULATORY_CARE_PROVIDER_SITE_OTHER): Payer: Self-pay | Admitting: *Deleted

## 2018-09-15 ENCOUNTER — Telehealth (INDEPENDENT_AMBULATORY_CARE_PROVIDER_SITE_OTHER): Payer: Self-pay | Admitting: *Deleted

## 2018-09-15 NOTE — Telephone Encounter (Signed)
Referring MD/PCP: fagan   Procedure: tcs  Reason/Indication:  fam hx colon ca  Has patient had this procedure before?  Yes, 2014  If so, when, by whom and where?    Is there a family history of colon cancer?  Yes, brother  Who?  What age when diagnosed?    Is patient diabetic?   yes      Does patient have prosthetic heart valve or mechanical valve?  no  Do you have a pacemaker/defibrillator?  no  Has patient ever had endocarditis/atrial fibrillation? no  Does patient use oxygen? no  Has patient had joint replacement within last 12 months?  no  Is patient constipated or do they take laxatives? no  Does patient have a history of alcohol/drug use?  no  Is patient on blood thinner such as Coumadin, Plavix and/or Aspirin? yes  Medications: asa 81 mg daily, vit d 1000 mg daily, lisinopril 10 mg daily, ibuprofen prn, simvastatin 20 mg daily, humalog sliding scale  Allergies: nkda  Medication Adjustment per Dr Charlena Cross, NP: asa 2 days before procedure  Procedure date & time: 10/16/18 at 930

## 2018-09-17 ENCOUNTER — Other Ambulatory Visit: Payer: Self-pay

## 2018-09-17 ENCOUNTER — Ambulatory Visit (INDEPENDENT_AMBULATORY_CARE_PROVIDER_SITE_OTHER): Payer: Self-pay

## 2018-09-23 NOTE — Telephone Encounter (Signed)
Okay to schedule colonoscopy with conscious sedation. What is his sliding scale coverage.  We may have to make an adjustment.

## 2018-09-24 NOTE — Telephone Encounter (Signed)
Patient is on omni pod pump, he adjusts insulin depending on how and what he eats

## 2018-09-29 NOTE — Telephone Encounter (Signed)
Patient can continue insulin at a basal or a fasting rate.

## 2018-12-02 ENCOUNTER — Other Ambulatory Visit (INDEPENDENT_AMBULATORY_CARE_PROVIDER_SITE_OTHER): Payer: Self-pay | Admitting: *Deleted

## 2018-12-02 ENCOUNTER — Encounter (INDEPENDENT_AMBULATORY_CARE_PROVIDER_SITE_OTHER): Payer: Self-pay | Admitting: *Deleted

## 2018-12-17 ENCOUNTER — Ambulatory Visit (INDEPENDENT_AMBULATORY_CARE_PROVIDER_SITE_OTHER): Payer: Self-pay

## 2018-12-17 ENCOUNTER — Other Ambulatory Visit: Payer: Self-pay

## 2018-12-17 ENCOUNTER — Telehealth (INDEPENDENT_AMBULATORY_CARE_PROVIDER_SITE_OTHER): Payer: Self-pay | Admitting: *Deleted

## 2018-12-17 NOTE — Telephone Encounter (Signed)
Referring MD/PCP: fagan   Procedure: tcs  Reason/Indication:  fam hx colon ca  Has patient had this procedure before?  Yes, 2014             If so, when, by whom and where?    Is there a family history of colon cancer?  Yes, brother             Who?  What age when diagnosed?    Is patient diabetic?   yes                                                  Does patient have prosthetic heart valve or mechanical valve?  no  Do you have a pacemaker/defibrillator?  no  Has patient ever had endocarditis/atrial fibrillation? no  Does patient use oxygen? no  Has patient had joint replacement within last 12 months?  no  Is patient constipated or do they take laxatives? no  Does patient have a history of alcohol/drug use?  no  Is patient on blood thinner such as Coumadin, Plavix and/or Aspirin? yes  Medications: asa 81 mg daily, vit d 1000 mg daily, lisinopril 10 mg daily, ibuprofen prn, simvastatin 20 mg daily, humalog sliding scale  Allergies: nkda  Medication Adjustment per Dr Charlena Cross, NP: asa 2 days before procedure, continue insulin at a basal or a fasting rate  Procedure date & time: 01/14/19 at 930

## 2019-01-05 NOTE — Telephone Encounter (Signed)
Colonoscopy with conscious sedation 

## 2019-01-12 ENCOUNTER — Other Ambulatory Visit (HOSPITAL_COMMUNITY): Payer: BC Managed Care – PPO

## 2019-01-14 DIAGNOSIS — Z800 Family history of malignant neoplasm of digestive organs: Secondary | ICD-10-CM | POA: Insufficient documentation

## 2019-01-29 ENCOUNTER — Other Ambulatory Visit: Payer: Self-pay

## 2019-01-29 ENCOUNTER — Other Ambulatory Visit: Payer: BC Managed Care – PPO

## 2019-01-29 ENCOUNTER — Ambulatory Visit: Payer: BC Managed Care – PPO | Attending: Internal Medicine

## 2019-01-29 DIAGNOSIS — Z20822 Contact with and (suspected) exposure to covid-19: Secondary | ICD-10-CM

## 2019-01-31 LAB — NOVEL CORONAVIRUS, NAA: SARS-CoV-2, NAA: NOT DETECTED

## 2019-03-03 ENCOUNTER — Other Ambulatory Visit (INDEPENDENT_AMBULATORY_CARE_PROVIDER_SITE_OTHER): Payer: Self-pay | Admitting: *Deleted

## 2019-03-03 ENCOUNTER — Telehealth (INDEPENDENT_AMBULATORY_CARE_PROVIDER_SITE_OTHER): Payer: Self-pay | Admitting: *Deleted

## 2019-03-03 ENCOUNTER — Encounter (INDEPENDENT_AMBULATORY_CARE_PROVIDER_SITE_OTHER): Payer: Self-pay | Admitting: *Deleted

## 2019-03-03 MED ORDER — SUPREP BOWEL PREP KIT 17.5-3.13-1.6 GM/177ML PO SOLN: 1.0000 | Freq: Once | ORAL | 0 refills | Status: AC

## 2019-03-03 NOTE — Telephone Encounter (Signed)
Patient need suprep (copay card) TCS sch'd 3/3

## 2019-03-05 ENCOUNTER — Telehealth (INDEPENDENT_AMBULATORY_CARE_PROVIDER_SITE_OTHER): Payer: Self-pay | Admitting: *Deleted

## 2019-03-05 ENCOUNTER — Ambulatory Visit (INDEPENDENT_AMBULATORY_CARE_PROVIDER_SITE_OTHER): Payer: Self-pay

## 2019-03-05 ENCOUNTER — Other Ambulatory Visit: Payer: Self-pay

## 2019-03-05 NOTE — Telephone Encounter (Signed)
Referring MD/PCP:fagan   Procedure:tcs  Reason/Indication:fam hx colon ca  Has patient had this procedure before?Yes, 2014 If so, when, by whom and where?   Is there a family history of colon cancer?Yes, brother Who? What age when diagnosed?   Is patient diabetic?yes  Does patient have prosthetic heart valve or mechanical valve?no  Do you have a pacemaker/defibrillator?no  Has patient ever had endocarditis/atrial fibrillation?no  Does patient use oxygen?no  Has patient had joint replacement within last 12 months?no  Is patient constipated or do they take laxatives?no  Does patient have a history of alcohol/drug use?no  Is patient on blood thinner such as Coumadin, Plavix and/or Aspirin?yes  Medications:asa 81 mg daily, vit d 1000 mg daily, lisinopril 10 mg daily, ibuprofen prn, simvastatin 20 mg daily, humalog sliding scale  Allergies:nkda  Medication Adjustment per Dr Karilyn Cota: asa 2 days before procedure, continue insulin at a basal or a fasting rate  Procedure date & time:3/3/21930

## 2019-03-28 NOTE — Telephone Encounter (Signed)
Colonoscopy with conscious sedation 

## 2019-03-30 ENCOUNTER — Other Ambulatory Visit (HOSPITAL_COMMUNITY)
Admission: RE | Admit: 2019-03-30 | Discharge: 2019-03-30 | Disposition: A | Discharge status: Home / Self Care | Payer: BC Managed Care – PPO | Source: Ambulatory Visit | Attending: Internal Medicine | Admitting: Internal Medicine

## 2019-03-30 ENCOUNTER — Other Ambulatory Visit: Payer: Self-pay

## 2019-03-31 ENCOUNTER — Other Ambulatory Visit: Payer: Self-pay | Admitting: Internal Medicine

## 2019-03-31 ENCOUNTER — Other Ambulatory Visit (HOSPITAL_COMMUNITY): Payer: Self-pay | Admitting: Internal Medicine

## 2019-03-31 DIAGNOSIS — F17200 Nicotine dependence, unspecified, uncomplicated: Secondary | ICD-10-CM

## 2019-04-01 ENCOUNTER — Ambulatory Visit (HOSPITAL_COMMUNITY)
Admission: RE | Admit: 2019-04-01 | Payer: BC Managed Care – PPO | Source: Home / Self Care | Admitting: Internal Medicine

## 2019-04-01 ENCOUNTER — Encounter (HOSPITAL_COMMUNITY): Admission: RE | Payer: Self-pay | Source: Home / Self Care

## 2019-04-01 DIAGNOSIS — Z800 Family history of malignant neoplasm of digestive organs: Secondary | ICD-10-CM | POA: Insufficient documentation

## 2019-04-01 SURGERY — COLONOSCOPY
Anesthesia: Moderate Sedation

## 2019-04-16 ENCOUNTER — Ambulatory Visit (HOSPITAL_COMMUNITY): Payer: BC Managed Care – PPO

## 2019-04-28 ENCOUNTER — Other Ambulatory Visit: Payer: Self-pay

## 2019-04-28 ENCOUNTER — Ambulatory Visit (HOSPITAL_COMMUNITY)
Admission: RE | Admit: 2019-04-28 | Discharge: 2019-04-28 | Disposition: A | Discharge status: Home / Self Care | Payer: BC Managed Care – PPO | Source: Ambulatory Visit | Attending: Internal Medicine | Admitting: Internal Medicine

## 2019-04-28 DIAGNOSIS — F17200 Nicotine dependence, unspecified, uncomplicated: Secondary | ICD-10-CM | POA: Insufficient documentation

## 2020-12-30 ENCOUNTER — Encounter (HOSPITAL_COMMUNITY): Payer: Self-pay | Admitting: *Deleted

## 2020-12-30 ENCOUNTER — Emergency Department (HOSPITAL_COMMUNITY)
Admission: EM | Admit: 2020-12-30 | Discharge: 2020-12-30 | Disposition: A | Discharge status: Home / Self Care | Payer: BC Managed Care – PPO | Attending: Emergency Medicine | Admitting: Emergency Medicine

## 2020-12-30 ENCOUNTER — Emergency Department (HOSPITAL_COMMUNITY): Payer: BC Managed Care – PPO

## 2020-12-30 DIAGNOSIS — F17210 Nicotine dependence, cigarettes, uncomplicated: Secondary | ICD-10-CM | POA: Diagnosis not present

## 2020-12-30 DIAGNOSIS — R1110 Vomiting, unspecified: Secondary | ICD-10-CM

## 2020-12-30 DIAGNOSIS — E871 Hypo-osmolality and hyponatremia: Secondary | ICD-10-CM | POA: Insufficient documentation

## 2020-12-30 DIAGNOSIS — Z20822 Contact with and (suspected) exposure to covid-19: Secondary | ICD-10-CM | POA: Diagnosis not present

## 2020-12-30 DIAGNOSIS — J101 Influenza due to other identified influenza virus with other respiratory manifestations: Secondary | ICD-10-CM | POA: Insufficient documentation

## 2020-12-30 DIAGNOSIS — Z7982 Long term (current) use of aspirin: Secondary | ICD-10-CM | POA: Insufficient documentation

## 2020-12-30 DIAGNOSIS — E0865 Diabetes mellitus due to underlying condition with hyperglycemia: Secondary | ICD-10-CM

## 2020-12-30 DIAGNOSIS — R112 Nausea with vomiting, unspecified: Secondary | ICD-10-CM | POA: Diagnosis present

## 2020-12-30 DIAGNOSIS — Z79899 Other long term (current) drug therapy: Secondary | ICD-10-CM | POA: Insufficient documentation

## 2020-12-30 DIAGNOSIS — E1165 Type 2 diabetes mellitus with hyperglycemia: Secondary | ICD-10-CM | POA: Insufficient documentation

## 2020-12-30 DIAGNOSIS — Z794 Long term (current) use of insulin: Secondary | ICD-10-CM | POA: Insufficient documentation

## 2020-12-30 DIAGNOSIS — R001 Bradycardia, unspecified: Secondary | ICD-10-CM | POA: Diagnosis not present

## 2020-12-30 LAB — COMPREHENSIVE METABOLIC PANEL
ALT: 21 U/L (ref 0–44)
AST: 25 U/L (ref 15–41)
Albumin: 4.2 g/dL (ref 3.5–5.0)
Alkaline Phosphatase: 99 U/L (ref 38–126)
Anion gap: 12 (ref 5–15)
BUN: 20 mg/dL (ref 6–20)
CO2: 25 mmol/L (ref 22–32)
Calcium: 8.9 mg/dL (ref 8.9–10.3)
Chloride: 97 mmol/L — ABNORMAL LOW (ref 98–111)
Creatinine, Ser: 0.73 mg/dL (ref 0.61–1.24)
GFR, Estimated: >60 mL/min (ref >60)
Glucose, Bld: 299 mg/dL — ABNORMAL HIGH (ref 70–99)
Potassium: 4.5 mmol/L (ref 3.5–5.1)
Sodium: 134 mmol/L — ABNORMAL LOW (ref 135–145)
Total Bilirubin: 0.7 mg/dL (ref 0.3–1.2)
Total Protein: 7.2 g/dL (ref 6.5–8.1)

## 2020-12-30 LAB — URINALYSIS, MICROSCOPIC (REFLEX): Bacteria, UA: NONE SEEN

## 2020-12-30 LAB — CBC
HCT: 44.2 % (ref 39.0–52.0)
Hemoglobin: 14.8 g/dL (ref 13.0–17.0)
MCH: 29.8 pg (ref 26.0–34.0)
MCHC: 33.5 g/dL (ref 30.0–36.0)
MCV: 89.1 fL (ref 80.0–100.0)
Platelets: 276 10*3/uL (ref 150–400)
RBC: 4.96 MIL/uL (ref 4.22–5.81)
RDW: 13.4 % (ref 11.5–15.5)
WBC: 7 10*3/uL (ref 4.0–10.5)
nRBC: 0 % (ref 0.0–0.2)

## 2020-12-30 LAB — RESP PANEL BY RT-PCR (FLU A&B, COVID) ARPGX2
Influenza A by PCR: POSITIVE — AB
Influenza B by PCR: NEGATIVE
SARS Coronavirus 2 by RT PCR: NEGATIVE

## 2020-12-30 LAB — URINALYSIS, ROUTINE W REFLEX MICROSCOPIC
Bilirubin Urine: NEGATIVE
Glucose, UA: >=500 mg/dL — AB
Ketones, ur: 40 mg/dL — AB
Leukocytes,Ua: NEGATIVE
Nitrite: NEGATIVE
Protein, ur: NEGATIVE mg/dL
Specific Gravity, Urine: 1.025 (ref 1.005–1.030)
pH: 6 (ref 5.0–8.0)

## 2020-12-30 LAB — LIPASE, BLOOD: Lipase: 23 U/L (ref 11–51)

## 2020-12-30 LAB — CK: Total CK: 76 U/L (ref 49–397)

## 2020-12-30 MED ORDER — SODIUM CHLORIDE 0.9 % IV BOLUS
500.0000 mL | Freq: Once | INTRAVENOUS | Status: AC
  Administered 2020-12-30: 500 mL via INTRAVENOUS

## 2020-12-30 MED ORDER — ONDANSETRON HCL 8 MG PO TABS: 8.0000 mg | ORAL_TABLET | Freq: Three times a day (TID) | ORAL | 0 refills | Status: DC | PRN

## 2020-12-30 MED ORDER — ACETAMINOPHEN 500 MG PO TABS
1000.0000 mg | ORAL_TABLET | Freq: Once | ORAL | Status: AC
  Administered 2020-12-30: 1000 mg via ORAL
  Filled 2020-12-30: qty 2

## 2020-12-30 MED ORDER — ONDANSETRON HCL 4 MG/2ML IJ SOLN
4.0000 mg | Freq: Once | INTRAMUSCULAR | Status: AC
  Administered 2020-12-30: 4 mg via INTRAVENOUS
  Filled 2020-12-30: qty 2

## 2020-12-30 MED ORDER — KETOROLAC TROMETHAMINE 15 MG/ML IJ SOLN
15.0000 mg | Freq: Once | INTRAMUSCULAR | Status: AC
  Administered 2020-12-30: 15 mg via INTRAVENOUS
  Filled 2020-12-30: qty 1

## 2020-12-30 NOTE — ED Triage Notes (Signed)
Nausea, vomiting for 3 days

## 2020-12-30 NOTE — ED Notes (Signed)
Gave pt water 

## 2020-12-30 NOTE — ED Provider Notes (Signed)
[facility] EMERGENCY DEPARTMENT Provider Note   CSN: [id number] Arrival date & time: 12/30/20  1124     History Chief Complaint  Patient presents with   Nausea    Ronald Wilkinson is a 60 y.o. male.  Patient presents with nausea vomiting body aches for 3 days.  Patient's primary doctor sent in due to persistent worsening symptoms.  Patient is unable to tolerate any fluids.  No known sick contacts.  Patient is diabetic typically controlled follows with primary doctor.  High cholesterol and cigarette smoking history.  Symptoms intermittent.  No blood in the vomit.  No diarrhea.      Past Medical History:  Diagnosis Date   HYPERCHOLESTEROLEMIA 12/27/2009   IDDM 04/25/2009   SMOKER 04/25/2009    Patient Active Problem List   Diagnosis Date Noted   Family hx of colon cancer 03/05/2018   HYPERCHOLESTEROLEMIA 12/27/2009   IDDM 04/25/2009   SMOKER 04/25/2009    Past Surgical History:  Procedure Laterality Date   COLONOSCOPY N/A 10/29/2012   Procedure: COLONOSCOPY;  Surgeon: Malissa Hippo, MD;  Location: [facility];  Service: Endoscopy;  Laterality: N/A;  730   OPEN REDUCTION INTERNAL FIXATION (ORIF) HAND Right        Family History  Problem Relation Age of Onset   Diabetes Mother    Diabetes Father     Social History   Tobacco Use   Smoking status: Every Day    Packs/day: 0.50    Years: 41.00    Pack years: 20.50    Types: Cigarettes   Smokeless tobacco: Never  Substance Use Topics   Alcohol use: Yes    Comment: occ   Drug use: No    Home Medications Prior to Admission medications   Medication Sig Start Date End Date Taking? Authorizing Provider  aspirin EC 81 MG tablet Take 81 mg by mouth daily.    [provider]  Cholecalciferol (VITAMIN D-3) 5000 UNITS TABS Take 5,000 Units by mouth every other day.     [provider]  Glucos-Chond-Hyal Ac-Ca Fructo (MOVE FREE JOINT HEALTH ADVANCE PO) Take 1 tablet by mouth daily.     [provider]  ibuprofen (ADVIL,MOTRIN) 200 MG tablet Take 800 mg by mouth daily.    [provider]  insulin lispro (HUMALOG) 100 UNIT/ML cartridge Inject into the skin. Insulin pump    [provider]  lisinopril (ZESTRIL) 10 MG tablet Take 10 mg by mouth daily.    [provider]  Multiple Vitamin (MULTIVITAMIN WITH MINERALS) TABS tablet Take 1 tablet by mouth daily.    [provider]  simvastatin (ZOCOR) 20 MG tablet Take 20 mg by mouth every evening.    [provider]    Allergies    Patient has no known allergies.  Review of Systems   Review of Systems  Constitutional:  Negative for chills and fever.  HENT:  Negative for congestion.   Eyes:  Negative for visual disturbance.  Respiratory:  Positive for cough. Negative for shortness of breath.   Cardiovascular:  Negative for chest pain.  Gastrointestinal:  Positive for nausea and vomiting. Negative for abdominal pain.  Genitourinary:  Negative for dysuria and flank pain.  Musculoskeletal:  Positive for arthralgias. Negative for back pain, neck pain and neck stiffness.  Skin:  Negative for rash.  Neurological:  Positive for light-headedness. Negative for headaches.   Physical Exam Updated Vital Signs BP (!) 156/68   Pulse (!) 49  Temp 98.8 F (37.1 C) (Oral)   Resp 18   Ht 6' (1.829 m)   Wt 70.9 kg   SpO2 100%   BMI 21.18 kg/m   Physical Exam Vitals and nursing note reviewed.  Constitutional:      General: He is not in acute distress.    Appearance: He is well-developed. He is ill-appearing.  HENT:     Head: Normocephalic and atraumatic.     Mouth/Throat:     Mouth: Mucous membranes are dry.  Eyes:     General:        Right eye: No discharge.        Left eye: No discharge.     Conjunctiva/sclera: Conjunctivae normal.  Neck:     Trachea: No tracheal deviation.  Cardiovascular:     Rate and Rhythm: Regular rhythm. Bradycardia present.     Heart sounds:  No murmur heard. Pulmonary:     Effort: Pulmonary effort is normal.     Breath sounds: Normal breath sounds.  Abdominal:     General: There is no distension.     Palpations: Abdomen is soft.     Tenderness: There is no abdominal tenderness. There is no guarding.  Musculoskeletal:        General: No swelling.     Cervical back: Normal range of motion and neck supple. No rigidity.  Skin:    General: Skin is warm.     Capillary Refill: Capillary refill takes less than 2 seconds.     Findings: No rash.  Neurological:     General: No focal deficit present.     Mental Status: He is alert.     Cranial Nerves: No cranial nerve deficit.  Psychiatric:        Mood and Affect: Mood normal.    ED Results / Procedures / Treatments   Labs (all labs ordered are listed, but only abnormal results are displayed) Labs Reviewed  RESP PANEL BY RT-PCR (FLU A&B, COVID) ARPGX2 - Abnormal; Notable for the following components:      Result Value   Influenza A by PCR POSITIVE (*)    All other components within normal limits  COMPREHENSIVE METABOLIC PANEL - Abnormal; Notable for the following components:   Sodium 134 (*)    Chloride 97 (*)    Glucose, Bld 299 (*)    All other components within normal limits  LIPASE, BLOOD  CBC  URINALYSIS, ROUTINE W REFLEX MICROSCOPIC  CK    EKG EKG Interpretation  Date/Time:  Friday December 30 2020 12:14:54 EST Ventricular Rate:  53 PR Interval:  93 QRS Duration: 123 QT Interval:  512 QTC Calculation: 481 R Axis:   88 Text Interpretation: Ectopic atrial rhythm Short PR interval Nonspecific intraventricular conduction delay Baseline wander in lead(s) V1 Confirmed by Blane Ohara [phone]) on 12/30/2020 3:14:36 PM  Radiology No results found.  Procedures Procedures   Medications Ordered in ED Medications  ondansetron (ZOFRAN) injection 4 mg (4 mg Intravenous Given 12/30/20 1311)  sodium chloride 0.9 % bolus 500 mL (500 mLs Intravenous New Bag/Given  12/30/20 1334)  sodium chloride 0.9 % bolus 500 mL (500 mLs Intravenous New Bag/Given 12/30/20 1334)  ketorolac (TORADOL) 15 MG/ML injection 15 mg (15 mg Intravenous Given 12/30/20 1514)  sodium chloride 0.9 % bolus 500 mL (500 mLs Intravenous New Bag/Given 12/30/20 1514)  acetaminophen (TYLENOL) tablet 1,000 mg (1,000 mg Oral Given 12/30/20 1514)    ED Course  I have reviewed the triage vital signs  and the nursing notes.  Pertinent labs & imaging results that were available during my care of the patient were reviewed by me and considered in my medical decision making (see chart for details).    MDM Rules/Calculators/A&P                           Patient diabetes history presents with recurrent vomiting for 3 days, general weakness and body aches.  Discussed concern for viral/influenza versus metabolic or significant dehydration, rhabdomyolysis, pneumonia, other.  Patient generally weak on exam.  IV fluid bolus ordered, repeated.  Zofran given for nausea which had mild improvement.  Patient overall still feels generally unwell on reassessment.  Blood work reviewed showing mild hyponatremia 134, glucose 299.  Influenza delayed however returned positive which explains most of symptoms.  CK added due to significant muscle cramping and pain.  Repeat IV fluid bolus.  Patient care will be signed out to reassess and if patient is tolerating oral liquids feels improved plan for discharge if not hospitalist for observation.  Patient out of window for antivirals.  Chest x-ray pending.  EKG/pulse reviewed sinus bradycardia nonspecific findings.  Ronald Wilkinson was evaluated in Emergency Department on 12/30/2020 for the symptoms described in the history of present illness. He was evaluated in the context of the global COVID-19 pandemic, which necessitated consideration that the patient might be at risk for infection with the SARS-CoV-2 virus that causes COVID-19. Institutional protocols and algorithms  that pertain to the evaluation of patients at risk for COVID-19 are in a state of rapid change based on information released by regulatory bodies including the CDC and federal and state organizations. These policies and algorithms were followed during the patient's care in the ED.  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

## 2020-12-30 NOTE — Discharge Instructions (Signed)
The testing today indicates a high of influenza A that is likely causing you to feel bad, and have vomiting.  Your blood sugar was elevated.  Continue taking your usual diabetes medicine.  Start with a clear liquid diet and try to drink 2 L of water every day.  Gradually advance to regular foods as tolerated.  We sent a prescription for ondansetron, to help with nausea and vomiting to your pharmacy follow-up with your doctor or return here if not better in a few days or having other problems or concerns.

## 2020-12-30 NOTE — ED Provider Notes (Signed)
4:15 PM-checkout from Dr. Jodi Mourning to evaluate patient after treatment, and consider discharge.  He has been diagnosed with influenza A.  He has been ill about 3 days.  5:03 PM-treatment so far has included Zofran, IV fluid bolus x2, ketorolac and Tylenol.  5:12 PM repeat vitals with bradycardia.  Patient not on any rate lowering medications and has been persistently bradycardic here.  He is also mildly hypertensive.  At this time the patient is alert and comfortable.  He does not have any additional complaints.  I explained to him that it was safe to go home and he was agreeable.  MDM-influenza A with vomiting, nontoxic appearance with reassuring evaluation.  Nonspecific bradycardia, can be followed up as an outpatient.  Claudication hospitalization at this time.  Plan-send Zofran prescription to his pharmacy.  Advised on symptomatic treatment and requirements for return    Mancel Bale, MD 12/30/20 1718

## 2021-01-30 ENCOUNTER — Encounter (HOSPITAL_COMMUNITY): Payer: Self-pay | Admitting: Radiology

## 2021-03-12 ENCOUNTER — Encounter (HOSPITAL_COMMUNITY): Payer: Self-pay | Admitting: Emergency Medicine

## 2021-03-12 ENCOUNTER — Observation Stay (HOSPITAL_COMMUNITY)
Admission: EM | Admit: 2021-03-12 | Discharge: 2021-03-14 | Disposition: A | Discharge status: Home / Self Care | Payer: BC Managed Care – PPO | Attending: Internal Medicine | Admitting: Internal Medicine

## 2021-03-12 DIAGNOSIS — D72829 Elevated white blood cell count, unspecified: Secondary | ICD-10-CM

## 2021-03-12 DIAGNOSIS — E1065 Type 1 diabetes mellitus with hyperglycemia: Secondary | ICD-10-CM | POA: Insufficient documentation

## 2021-03-12 DIAGNOSIS — Z79899 Other long term (current) drug therapy: Secondary | ICD-10-CM | POA: Diagnosis not present

## 2021-03-12 DIAGNOSIS — Z794 Long term (current) use of insulin: Secondary | ICD-10-CM | POA: Diagnosis not present

## 2021-03-12 DIAGNOSIS — F17210 Nicotine dependence, cigarettes, uncomplicated: Secondary | ICD-10-CM | POA: Diagnosis not present

## 2021-03-12 DIAGNOSIS — I10 Essential (primary) hypertension: Secondary | ICD-10-CM | POA: Diagnosis not present

## 2021-03-12 DIAGNOSIS — Z7982 Long term (current) use of aspirin: Secondary | ICD-10-CM | POA: Diagnosis not present

## 2021-03-12 DIAGNOSIS — E871 Hypo-osmolality and hyponatremia: Secondary | ICD-10-CM | POA: Insufficient documentation

## 2021-03-12 DIAGNOSIS — E1010 Type 1 diabetes mellitus with ketoacidosis without coma: Secondary | ICD-10-CM | POA: Diagnosis not present

## 2021-03-12 DIAGNOSIS — Z720 Tobacco use: Secondary | ICD-10-CM | POA: Diagnosis present

## 2021-03-12 DIAGNOSIS — R1110 Vomiting, unspecified: Secondary | ICD-10-CM | POA: Diagnosis present

## 2021-03-12 DIAGNOSIS — Z20822 Contact with and (suspected) exposure to covid-19: Secondary | ICD-10-CM | POA: Diagnosis not present

## 2021-03-12 DIAGNOSIS — E785 Hyperlipidemia, unspecified: Secondary | ICD-10-CM | POA: Diagnosis present

## 2021-03-12 DIAGNOSIS — E1110 Type 2 diabetes mellitus with ketoacidosis without coma: Secondary | ICD-10-CM | POA: Diagnosis present

## 2021-03-12 LAB — BLOOD GAS, VENOUS
Acid-base deficit: 10.2 mmol/L — ABNORMAL HIGH (ref 0.0–2.0)
Bicarbonate: 14.8 mmol/L — ABNORMAL LOW (ref 20.0–28.0)
FIO2: 21
O2 Saturation: 36.2 %
Patient temperature: 36.7
pCO2, Ven: 41 mmHg — ABNORMAL LOW (ref 44.0–60.0)
pH, Ven: 7.218 — ABNORMAL LOW (ref 7.250–7.430)
pO2, Ven: <31 mmHg — CL (ref 32.0–45.0)

## 2021-03-12 LAB — CBC
HCT: 43.8 % (ref 39.0–52.0)
Hemoglobin: 14.4 g/dL (ref 13.0–17.0)
MCH: 29.6 pg (ref 26.0–34.0)
MCHC: 32.9 g/dL (ref 30.0–36.0)
MCV: 90.1 fL (ref 80.0–100.0)
Platelets: 449 10*3/uL — ABNORMAL HIGH (ref 150–400)
RBC: 4.86 MIL/uL (ref 4.22–5.81)
RDW: 13.2 % (ref 11.5–15.5)
WBC: 22.8 10*3/uL — ABNORMAL HIGH (ref 4.0–10.5)
nRBC: 0 % (ref 0.0–0.2)

## 2021-03-12 LAB — CBG MONITORING, ED: Glucose-Capillary: 450 mg/dL — ABNORMAL HIGH (ref 70–99)

## 2021-03-12 MED ORDER — LACTATED RINGERS IV BOLUS
1000.0000 mL | Freq: Once | INTRAVENOUS | Status: AC
  Administered 2021-03-12: 1000 mL via INTRAVENOUS

## 2021-03-12 MED ORDER — FENTANYL CITRATE PF 50 MCG/ML IJ SOSY
50.0000 ug | PREFILLED_SYRINGE | Freq: Once | INTRAMUSCULAR | Status: AC
  Administered 2021-03-12: 50 ug via INTRAVENOUS
  Filled 2021-03-12: qty 1

## 2021-03-12 NOTE — ED Provider Notes (Signed)
[facility] Ronald Ronald Wilkinson Provider Note MRN:  [id number]  Arrival date & time: 03/13/21     Chief Complaint   Hyperglycemia   History of Present Illness   Ronald Ronald Wilkinson Ronald a 61 y.o. year-old Ronald Wilkinson with a history of diabetes presenting to the ED with chief complaint of hyperglycemia.  Patient endorsing malaise, fatigue, diffuse body pain, muscle aches all day today.  Has not had his normal insulin pump today because the delivery was delayed.  Had hot flash earlier today, not sure if it was a fever.  Had an episode of vomiting as well.  No diarrhea, no chest pain, no shortness of breath, no cough, no cold-like symptoms.  Review of Systems  A thorough review of systems was obtained and all systems are negative except as noted in the HPI and PMH.   Patient's Health History    Past Medical History:  Diagnosis Date   HYPERCHOLESTEROLEMIA 12/27/2009   IDDM 04/25/2009   SMOKER 04/25/2009    Past Surgical History:  Procedure Laterality Date   COLONOSCOPY N/A 10/29/2012   Procedure: COLONOSCOPY;  Surgeon: Malissa Hippo, MD;  Location: [facility];  Service: Endoscopy;  Laterality: N/A;  730   OPEN REDUCTION INTERNAL FIXATION (ORIF) HAND Right     Family History  Problem Relation Age of Onset   Diabetes Mother    Diabetes Father     Social History   Socioeconomic History   Marital status: Married    Spouse name: Not on file   Number of children: Not on file   Years of education: Not on file   Highest education level: Not on file  Occupational History    Employer: [employer]    Comment: textiles  Tobacco Use   Smoking status: Every Day    Packs/day: 0.50    Years: 41.00    Pack years: 20.50    Types: Cigarettes   Smokeless tobacco: Never  Substance and Sexual Activity   Alcohol use: Yes    Comment: occ   Drug use: No   Sexual activity: Yes    Birth control/protection: None  Other Topics Concern   Not on file  Social History  Narrative   Works Medical sales representative.   Social Determinants of Health   Financial Resource Strain: Not on file  Food Insecurity: Not on file  Transportation Needs: Not on file  Physical Activity: Not on file  Stress: Not on file  Social Connections: Not on file  Intimate Partner Violence: Not on file     Physical Exam   Vitals:   03/13/21 0000 03/13/21 0030  BP: (!) 124/49 (!) 120/91  Pulse: 82 66  Resp: 15 16  Temp:    SpO2: 100% 100%    CONSTITUTIONAL: Well-appearing, NAD NEURO/PSYCH:  Alert and oriented x 3, no focal deficits EYES:  eyes equal and reactive ENT/NECK:  no LAD, no JVD CARDIO: Regular rate, well-perfused, normal S1 and S2 PULM:  CTAB no wheezing or rhonchi GI/GU:  non-distended, non-tender MSK/SPINE:  No gross deformities, no edema SKIN:  no rash, atraumatic   *Additional and/or pertinent findings included in MDM below  Diagnostic and Interventional Summary    EKG Interpretation  Date/Time:  Sunday March 12 2021 23:30:01 EST Ventricular Rate:  66 PR Interval:  145 QRS Duration: 117 QT Interval:  449 QTC Calculation: 471 R Axis:   90 Text Interpretation: Sinus rhythm Ventricular premature complex Nonspecific intraventricular conduction delay Minimal ST depression, diffuse leads Confirmed  by Kennis Carina [phone]) on 03/12/2021 11:41:06 PM       Labs Reviewed  CBC - Abnormal; Notable for the following components:      Result Value   WBC 22.8 (*)    Platelets 449 (*)    All other components within normal limits  COMPREHENSIVE METABOLIC PANEL - Abnormal; Notable for the following components:   Sodium 131 (*)    Chloride 96 (*)    CO2 15 (*)    Glucose, Bld 452 (*)    BUN 28 (*)    Total Bilirubin 1.7 (*)    Anion gap 20 (*)    All other components within normal limits  CK - Abnormal; Notable for the following components:   Total CK 45 (*)    All other components within normal limits  LACTIC ACID, PLASMA - Abnormal; Notable  for the following components:   Lactic Acid, Venous 6.4 (*)    All other components within normal limits  BLOOD GAS, VENOUS - Abnormal; Notable for the following components:   pH, Ven 7.218 (*)    pCO2, Ven 41.0 (*)    pO2, Ven <31.0 (*)    Bicarbonate 14.8 (*)    Acid-base deficit 10.2 (*)    All other components within normal limits  BETA-HYDROXYBUTYRIC ACID - Abnormal; Notable for the following components:   Beta-Hydroxybutyric Acid 2.33 (*)    All other components within normal limits  CBG MONITORING, ED - Abnormal; Notable for the following components:   Glucose-Capillary 450 (*)    All other components within normal limits  CBG MONITORING, ED - Abnormal; Notable for the following components:   Glucose-Capillary 432 (*)    All other components within normal limits  RESP PANEL BY RT-PCR (FLU A&B, COVID) ARPGX2  LIPASE, BLOOD  PROTIME-INR  URINALYSIS, ROUTINE W REFLEX MICROSCOPIC  BETA-HYDROXYBUTYRIC ACID  BETA-HYDROXYBUTYRIC ACID  LACTIC ACID, PLASMA  TROPONIN I (HIGH SENSITIVITY)    No orders to display    Medications  insulin regular, human (MYXREDLIN) 100 units/ 100 mL infusion (10.5 Units/hr Intravenous New Bag/Given 03/13/21 0046)  lactated ringers infusion (has no administration in time range)  dextrose 5 % in lactated ringers infusion (0 mLs Intravenous Hold 03/13/21 0022)  dextrose 50 % solution 0-50 mL (has no administration in time range)  potassium chloride 10 mEq in 100 mL IVPB (10 mEq Intravenous New Bag/Given 03/13/21 0047)  lactated ringers bolus 1,000 mL (0 mLs Intravenous Stopped 03/13/21 0051)  fentaNYL (SUBLIMAZE) injection 50 mcg (50 mcg Intravenous Given 03/12/21 2341)  lactated ringers bolus 1,000 mL ( Intravenous Infusion Verify 03/13/21 0107)     Procedures  /  Critical Care .Critical Care Performed by: Sabas Sous, MD Authorized by: Sabas Sous, MD   Critical care provider statement:    Critical care time (minutes):  45   Critical care  was necessary to treat or prevent imminent or life-threatening deterioration of the following conditions:  Metabolic crisis   Critical care was time spent personally by me on the following activities:  Development of treatment plan with patient or surrogate, discussions with consultants, evaluation of patient's response to treatment, examination of patient, ordering and review of laboratory studies, ordering and review of radiographic studies, ordering and performing treatments and interventions, pulse oximetry, re-evaluation of patient's condition and review of old charts  ED Course and Medical Decision Making  Initial Impression and Ddx Suspect hyperglycemia, dehydration, possibly DKA or electrolyte disturbance, rhabdomyolysis also considered given the body aches.  Awaiting labs, will reassess.  Past medical/surgical history that increases complexity of ED encounter: Diabetes  Interpretation of Diagnostics I personally reviewed the EKG and my interpretation Ronald as follows: Sinus rhythm with nonspecific ST findings    Labs are notable for a prominent leukocytosis, as well as a gap acidosis.  Consistent with diabetic ketoacidosis  Patient Reassessment and Ultimate Disposition/Management Patient feeling a bit better after fluids, starting insulin, will admit to medicine.  Patient management required discussion with the following services or consulting groups:  Hospitalist Service  Complexity of Problems Addressed Acute illness or injury that poses threat of life of bodily function  Additional Data Reviewed and Analyzed Further history obtained from: Further history from spouse/family member  Additional Factors Impacting ED Encounter Risk Consideration of hospitalization  Elmer Sow. Pilar Plate, MD [facility] Emergency Medicine [facility] [email or web address]  Final Clinical Impressions(s) / ED Diagnoses     ICD-10-CM   1. Diabetic ketoacidosis without coma associated  with type 2 diabetes mellitus (HCC)  E11.10       ED Discharge Orders     None        Discharge Instructions Discussed with and Provided to Patient:   Discharge Instructions   None      Sabas Sous, MD 03/13/21 0110

## 2021-03-12 NOTE — ED Triage Notes (Signed)
Pt c/o hyperglycemia, weakness, and body aches. Pt hasn't been able to get his pod for his insulin pump. Pt took 6u novolog just prior to leaving home.

## 2021-03-13 ENCOUNTER — Emergency Department (HOSPITAL_COMMUNITY): Payer: BC Managed Care – PPO

## 2021-03-13 ENCOUNTER — Other Ambulatory Visit: Payer: Self-pay

## 2021-03-13 DIAGNOSIS — E1110 Type 2 diabetes mellitus with ketoacidosis without coma: Secondary | ICD-10-CM | POA: Diagnosis present

## 2021-03-13 DIAGNOSIS — F17200 Nicotine dependence, unspecified, uncomplicated: Secondary | ICD-10-CM

## 2021-03-13 DIAGNOSIS — E1165 Type 2 diabetes mellitus with hyperglycemia: Secondary | ICD-10-CM

## 2021-03-13 DIAGNOSIS — E7800 Pure hypercholesterolemia, unspecified: Secondary | ICD-10-CM | POA: Diagnosis not present

## 2021-03-13 DIAGNOSIS — E871 Hypo-osmolality and hyponatremia: Secondary | ICD-10-CM | POA: Insufficient documentation

## 2021-03-13 DIAGNOSIS — E0810 Diabetes mellitus due to underlying condition with ketoacidosis without coma: Secondary | ICD-10-CM

## 2021-03-13 DIAGNOSIS — D72829 Elevated white blood cell count, unspecified: Secondary | ICD-10-CM | POA: Diagnosis not present

## 2021-03-13 DIAGNOSIS — I10 Essential (primary) hypertension: Secondary | ICD-10-CM

## 2021-03-13 DIAGNOSIS — E1010 Type 1 diabetes mellitus with ketoacidosis without coma: Secondary | ICD-10-CM

## 2021-03-13 LAB — BASIC METABOLIC PANEL
Anion gap: 15 (ref 5–15)
Anion gap: 8 (ref 5–15)
Anion gap: 9 (ref 5–15)
Anion gap: 8 (ref 5–15)
BUN: 26 mg/dL — ABNORMAL HIGH (ref 6–20)
BUN: 21 mg/dL — ABNORMAL HIGH (ref 6–20)
BUN: 18 mg/dL (ref 6–20)
BUN: 20 mg/dL (ref 6–20)
CO2: 16 mmol/L — ABNORMAL LOW (ref 22–32)
CO2: 23 mmol/L (ref 22–32)
CO2: 22 mmol/L (ref 22–32)
CO2: 24 mmol/L (ref 22–32)
Calcium: 9 mg/dL (ref 8.9–10.3)
Calcium: 9 mg/dL (ref 8.9–10.3)
Calcium: 9 mg/dL (ref 8.9–10.3)
Calcium: 8.8 mg/dL — ABNORMAL LOW (ref 8.9–10.3)
Chloride: 100 mmol/L (ref 98–111)
Chloride: 104 mmol/L (ref 98–111)
Chloride: 104 mmol/L (ref 98–111)
Chloride: 103 mmol/L (ref 98–111)
Creatinine, Ser: 0.86 mg/dL (ref 0.61–1.24)
Creatinine, Ser: 0.66 mg/dL (ref 0.61–1.24)
Creatinine, Ser: 0.58 mg/dL — ABNORMAL LOW (ref 0.61–1.24)
Creatinine, Ser: 0.81 mg/dL (ref 0.61–1.24)
GFR, Estimated: >60 mL/min (ref >60)
GFR, Estimated: >60 mL/min (ref >60)
GFR, Estimated: >60 mL/min (ref >60)
GFR, Estimated: >60 mL/min (ref >60)
Glucose, Bld: 354 mg/dL — ABNORMAL HIGH (ref 70–99)
Glucose, Bld: 183 mg/dL — ABNORMAL HIGH (ref 70–99)
Glucose, Bld: 163 mg/dL — ABNORMAL HIGH (ref 70–99)
Glucose, Bld: 216 mg/dL — ABNORMAL HIGH (ref 70–99)
Potassium: 3.8 mmol/L (ref 3.5–5.1)
Potassium: 4.2 mmol/L (ref 3.5–5.1)
Potassium: 4 mmol/L (ref 3.5–5.1)
Potassium: 3.7 mmol/L (ref 3.5–5.1)
Sodium: 131 mmol/L — ABNORMAL LOW (ref 135–145)
Sodium: 135 mmol/L (ref 135–145)
Sodium: 135 mmol/L (ref 135–145)
Sodium: 135 mmol/L (ref 135–145)

## 2021-03-13 LAB — CBC
HCT: 35.9 % — ABNORMAL LOW (ref 39.0–52.0)
HCT: 38.1 % — ABNORMAL LOW (ref 39.0–52.0)
Hemoglobin: 11.8 g/dL — ABNORMAL LOW (ref 13.0–17.0)
Hemoglobin: 12.8 g/dL — ABNORMAL LOW (ref 13.0–17.0)
MCH: 28.7 pg (ref 26.0–34.0)
MCH: 29.9 pg (ref 26.0–34.0)
MCHC: 32.9 g/dL (ref 30.0–36.0)
MCHC: 33.6 g/dL (ref 30.0–36.0)
MCV: 87.3 fL (ref 80.0–100.0)
MCV: 89 fL (ref 80.0–100.0)
Platelets: 341 10*3/uL (ref 150–400)
Platelets: 383 10*3/uL (ref 150–400)
RBC: 4.11 MIL/uL — ABNORMAL LOW (ref 4.22–5.81)
RBC: 4.28 MIL/uL (ref 4.22–5.81)
RDW: 13 % (ref 11.5–15.5)
RDW: 13.3 % (ref 11.5–15.5)
WBC: 17.7 10*3/uL — ABNORMAL HIGH (ref 4.0–10.5)
WBC: 19.6 10*3/uL — ABNORMAL HIGH (ref 4.0–10.5)
nRBC: 0 % (ref 0.0–0.2)
nRBC: 0 % (ref 0.0–0.2)

## 2021-03-13 LAB — URINALYSIS, ROUTINE W REFLEX MICROSCOPIC
Bilirubin Urine: NEGATIVE
Glucose, UA: >=500 mg/dL — AB
Hgb urine dipstick: NEGATIVE
Ketones, ur: 5 mg/dL — AB
Nitrite: NEGATIVE
Protein, ur: NEGATIVE mg/dL
Specific Gravity, Urine: 1.009 (ref 1.005–1.030)
pH: 6 (ref 5.0–8.0)

## 2021-03-13 LAB — BLOOD GAS, VENOUS
Acid-base deficit: 3.7 mmol/L — ABNORMAL HIGH (ref 0.0–2.0)
Bicarbonate: 20.8 mmol/L (ref 20.0–28.0)
Drawn by: 1517
FIO2: 21
O2 Saturation: 64.4 %
Patient temperature: 36.4
pCO2, Ven: 35.3 mmHg — ABNORMAL LOW (ref 44.0–60.0)
pH, Ven: 7.381 (ref 7.250–7.430)
pO2, Ven: 34.4 mmHg (ref 32.0–45.0)

## 2021-03-13 LAB — COMPREHENSIVE METABOLIC PANEL
ALT: 21 U/L (ref 0–44)
ALT: 20 U/L (ref 0–44)
AST: 26 U/L (ref 15–41)
AST: 21 U/L (ref 15–41)
Albumin: 4.3 g/dL (ref 3.5–5.0)
Albumin: 3.5 g/dL (ref 3.5–5.0)
Alkaline Phosphatase: 119 U/L (ref 38–126)
Alkaline Phosphatase: 94 U/L (ref 38–126)
Anion gap: 20 — ABNORMAL HIGH (ref 5–15)
Anion gap: 9 (ref 5–15)
BUN: 28 mg/dL — ABNORMAL HIGH (ref 6–20)
BUN: 25 mg/dL — ABNORMAL HIGH (ref 6–20)
CO2: 15 mmol/L — ABNORMAL LOW (ref 22–32)
CO2: 20 mmol/L — ABNORMAL LOW (ref 22–32)
Calcium: 9.4 mg/dL (ref 8.9–10.3)
Calcium: 8.9 mg/dL (ref 8.9–10.3)
Chloride: 96 mmol/L — ABNORMAL LOW (ref 98–111)
Chloride: 102 mmol/L (ref 98–111)
Creatinine, Ser: 0.97 mg/dL (ref 0.61–1.24)
Creatinine, Ser: 0.76 mg/dL (ref 0.61–1.24)
GFR, Estimated: >60 mL/min (ref >60)
GFR, Estimated: >60 mL/min (ref >60)
Glucose, Bld: 452 mg/dL — ABNORMAL HIGH (ref 70–99)
Glucose, Bld: 257 mg/dL — ABNORMAL HIGH (ref 70–99)
Potassium: 4.5 mmol/L (ref 3.5–5.1)
Potassium: 3.6 mmol/L (ref 3.5–5.1)
Sodium: 131 mmol/L — ABNORMAL LOW (ref 135–145)
Sodium: 131 mmol/L — ABNORMAL LOW (ref 135–145)
Total Bilirubin: 1.7 mg/dL — ABNORMAL HIGH (ref 0.3–1.2)
Total Bilirubin: 0.9 mg/dL (ref 0.3–1.2)
Total Protein: 7.3 g/dL (ref 6.5–8.1)
Total Protein: 6.1 g/dL — ABNORMAL LOW (ref 6.5–8.1)

## 2021-03-13 LAB — TROPONIN I (HIGH SENSITIVITY)
Troponin I (High Sensitivity): 5 ng/L (ref <18)
Troponin I (High Sensitivity): 7 ng/L (ref <18)

## 2021-03-13 LAB — RESP PANEL BY RT-PCR (FLU A&B, COVID) ARPGX2
Influenza A by PCR: NEGATIVE
Influenza B by PCR: NEGATIVE
SARS Coronavirus 2 by RT PCR: NEGATIVE

## 2021-03-13 LAB — GLUCOSE, CAPILLARY
Glucose-Capillary: 111 mg/dL — ABNORMAL HIGH (ref 70–99)
Glucose-Capillary: 132 mg/dL — ABNORMAL HIGH (ref 70–99)
Glucose-Capillary: 229 mg/dL — ABNORMAL HIGH (ref 70–99)
Glucose-Capillary: 271 mg/dL — ABNORMAL HIGH (ref 70–99)
Glucose-Capillary: 289 mg/dL — ABNORMAL HIGH (ref 70–99)
Glucose-Capillary: 361 mg/dL — ABNORMAL HIGH (ref 70–99)

## 2021-03-13 LAB — MAGNESIUM: Magnesium: 1.8 mg/dL (ref 1.7–2.4)

## 2021-03-13 LAB — BETA-HYDROXYBUTYRIC ACID
Beta-Hydroxybutyric Acid: 2.33 mmol/L — ABNORMAL HIGH (ref 0.05–0.27)
Beta-Hydroxybutyric Acid: 0.09 mmol/L (ref 0.05–0.27)

## 2021-03-13 LAB — CBG MONITORING, ED
Glucose-Capillary: 322 mg/dL — ABNORMAL HIGH (ref 70–99)
Glucose-Capillary: 379 mg/dL — ABNORMAL HIGH (ref 70–99)
Glucose-Capillary: 432 mg/dL — ABNORMAL HIGH (ref 70–99)

## 2021-03-13 LAB — LACTIC ACID, PLASMA
Lactic Acid, Venous: 6.4 mmol/L (ref 0.5–1.9)
Lactic Acid, Venous: 4.5 mmol/L (ref 0.5–1.9)

## 2021-03-13 LAB — PROTIME-INR
INR: 1 (ref 0.8–1.2)
Prothrombin Time: 13.3 seconds (ref 11.4–15.2)

## 2021-03-13 LAB — PHOSPHORUS: Phosphorus: 3.4 mg/dL (ref 2.5–4.6)

## 2021-03-13 LAB — LIPASE, BLOOD: Lipase: 21 U/L (ref 11–51)

## 2021-03-13 LAB — HIV ANTIBODY (ROUTINE TESTING W REFLEX): HIV Screen 4th Generation wRfx: NONREACTIVE

## 2021-03-13 LAB — MRSA NEXT GEN BY PCR, NASAL: MRSA by PCR Next Gen: NOT DETECTED

## 2021-03-13 LAB — CK: Total CK: 45 U/L — ABNORMAL LOW (ref 49–397)

## 2021-03-13 MED ORDER — INSULIN ASPART 100 UNIT/ML IJ SOLN
0.0000 [IU] | INTRAMUSCULAR | Status: DC
  Administered 2021-03-13: 1 [IU] via SUBCUTANEOUS

## 2021-03-13 MED ORDER — INSULIN GLARGINE-YFGN 100 UNIT/ML ~~LOC~~ SOLN
28.0000 [IU] | Freq: Every day | SUBCUTANEOUS | Status: DC
  Administered 2021-03-13 – 2021-03-14 (×2): 28 [IU] via SUBCUTANEOUS
  Filled 2021-03-13 (×3): qty 0.28

## 2021-03-13 MED ORDER — ENOXAPARIN SODIUM 40 MG/0.4ML IJ SOSY
40.0000 mg | PREFILLED_SYRINGE | INTRAMUSCULAR | Status: DC
  Administered 2021-03-13 – 2021-03-14 (×2): 40 mg via SUBCUTANEOUS
  Filled 2021-03-13 (×2): qty 0.4

## 2021-03-13 MED ORDER — LISINOPRIL 10 MG PO TABS
10.0000 mg | ORAL_TABLET | Freq: Every day | ORAL | Status: DC
  Administered 2021-03-13 – 2021-03-14 (×2): 10 mg via ORAL
  Filled 2021-03-13 (×2): qty 1

## 2021-03-13 MED ORDER — POTASSIUM CHLORIDE 10 MEQ/100ML IV SOLN
INTRAVENOUS | Status: AC
  Administered 2021-03-13: 10 meq via INTRAVENOUS
  Filled 2021-03-13: qty 100

## 2021-03-13 MED ORDER — DEXTROSE IN LACTATED RINGERS 5 % IV SOLN: INTRAVENOUS | Status: DC

## 2021-03-13 MED ORDER — POTASSIUM CHLORIDE 10 MEQ/100ML IV SOLN
10.0000 meq | INTRAVENOUS | Status: AC
  Administered 2021-03-13: 10 meq via INTRAVENOUS
  Filled 2021-03-13: qty 100

## 2021-03-13 MED ORDER — ROSUVASTATIN CALCIUM 20 MG PO TABS
20.0000 mg | ORAL_TABLET | Freq: Every day | ORAL | Status: DC
  Administered 2021-03-13: 20 mg via ORAL
  Filled 2021-03-13: qty 1

## 2021-03-13 MED ORDER — LACTATED RINGERS IV BOLUS
1000.0000 mL | Freq: Once | INTRAVENOUS | Status: AC
  Administered 2021-03-13: 1000 mL via INTRAVENOUS

## 2021-03-13 MED ORDER — LACTATED RINGERS IV SOLN: INTRAVENOUS | Status: DC

## 2021-03-13 MED ORDER — INSULIN ASPART 100 UNIT/ML IJ SOLN
4.0000 [IU] | Freq: Three times a day (TID) | INTRAMUSCULAR | Status: DC
  Administered 2021-03-13 – 2021-03-14 (×2): 4 [IU] via SUBCUTANEOUS

## 2021-03-13 MED ORDER — ONDANSETRON HCL 4 MG/2ML IJ SOLN: 4.0000 mg | Freq: Four times a day (QID) | INTRAMUSCULAR | Status: DC | PRN

## 2021-03-13 MED ORDER — CHLORHEXIDINE GLUCONATE CLOTH 2 % EX PADS
6.0000 | MEDICATED_PAD | Freq: Every day | CUTANEOUS | Status: DC
  Administered 2021-03-13 – 2021-03-14 (×2): 6 via TOPICAL

## 2021-03-13 MED ORDER — DEXTROSE 50 % IV SOLN: 0.0000 mL | INTRAVENOUS | Status: DC | PRN

## 2021-03-13 MED ORDER — INSULIN REGULAR(HUMAN) IN NACL 100-0.9 UT/100ML-% IV SOLN
INTRAVENOUS | Status: DC
  Administered 2021-03-13: 3 [IU]/h via INTRAVENOUS
  Administered 2021-03-13: 10.5 [IU]/h via INTRAVENOUS
  Filled 2021-03-13 (×2): qty 100

## 2021-03-13 MED ORDER — ASPIRIN EC 81 MG PO TBEC
81.0000 mg | DELAYED_RELEASE_TABLET | Freq: Every day | ORAL | Status: DC
  Administered 2021-03-13 – 2021-03-14 (×2): 81 mg via ORAL
  Filled 2021-03-13 (×2): qty 1

## 2021-03-13 MED ORDER — POTASSIUM CHLORIDE 10 MEQ/100ML IV SOLN
10.0000 meq | INTRAVENOUS | Status: AC
  Administered 2021-03-13 (×2): 10 meq via INTRAVENOUS
  Filled 2021-03-13 (×2): qty 100

## 2021-03-13 MED ORDER — INSULIN ASPART 100 UNIT/ML IJ SOLN
0.0000 [IU] | Freq: Three times a day (TID) | INTRAMUSCULAR | Status: DC
  Administered 2021-03-13 – 2021-03-14 (×2): 3 [IU] via SUBCUTANEOUS

## 2021-03-13 NOTE — Assessment & Plan Note (Deleted)
Na 131, this was possibly induced by hyperglycemia Corrected sodium level based on hyperglycemia is 137 Continue to monitor sodium level with morning labs

## 2021-03-13 NOTE — Assessment & Plan Note (Addendum)
-   Crestor 

## 2021-03-13 NOTE — Plan of Care (Signed)

## 2021-03-13 NOTE — Progress Notes (Addendum)
Inpatient Diabetes Program Recommendations  AACE/ADA: New Consensus Statement on Inpatient Glycemic Control (2015)  Target Ranges:  Prepandial:   less than 140 mg/dL      Peak postprandial:   less than 180 mg/dL (1-2 hours)      Critically ill patients:  140 - 180 mg/dL   Lab Results  Component Value Date   GLUCAP 271 (H) 03/13/2021   HGBA1C 9.8 (H) 04/21/2010    Review of Glycemic Control  Diabetes history: DM1 Outpatient Diabetes medications: Insulin pump (See below) Current orders for Inpatient glycemic control: IV insulin  Inpatient Diabetes Program Recommendations:   When ready to transition from IV insulin: -Give Semglee 28 units (80 % home basal) 2 hrs. Prior to IV insulin discontinued -Novolog 0-9 units q 4 hrs. While NPO and cover CBG when IV insulin discontinued -Add Novolog 4 units tid meal coverage if eats 50% when eating  Spoke with wife @ home regarding insulin pump supplies. Wife states supplies are scheduled to arrive by Wednesday and next appointment with endocrinology is 03/28/21. Wife agrees to call to reschedule appointment sooner if appointment available. Patient messed up one insulin pod and ran out with supplies taking longer than usual to arrive to their home. States patient does not have a dexcom sensor yet due to waiting on dexcom and insulin pump to merge together. Recent A1c per wife was approximately 9.0. Requested wife for she and her husband to discuss backup plan with his endocrinologist for future events to have basal available. Wife states understanding that taking only Humalog is not enough for husband since he has type 1 diabetes. Reviewed with wife patient will need subcutaneous insulin until insulin pump arrives and will be able to restart insulin pump 24 hrs. After basal insulin administered.  From endocrinology note 10/11/20 [facility]:  "Type 1 Diabetes Mellitus:   Interval History: He is using Omnipod but has not checked with  insurance about Dexcom. He has not decided if he wants to use the Dexcom yet.  The patient currently uses the following regimen: Humalog via OmniPod without CGM. Basal rates:  00:00 = 2.000 units/hour  10:00 = 1.100 units/hour  14:00 = 1.000 units/hour  17:30 = 1.200 units/hour  ================ 24-hour total basal: 35.7 units Boluses: Premeal boluses per Bolus Wizard using 1 unit per 10 gm carbs PLUS correction of 1 unit per 150 mg/dl above 829 mg/dl. Also does some correction boluses between meals. Averages 3.7 boluses per 24 hours.  ICR:  00:00 10  ISF: 00:00 150  Target 130"  Thank you, Terrill Wauters E. Aric Jost, RN, MSN, CDE  Diabetes Coordinator Inpatient Glycemic Control Team Team Pager [phone] (8am-5pm) 03/13/2021 9:44 AM

## 2021-03-13 NOTE — Progress Notes (Signed)
°  Progress Note   Patient: Ronald Wilkinson [id number] DOB: December 17, 1960 DOA: 03/12/2021     0 DOS: the patient was seen and examined on 03/13/2021   Brief hospital course: GUINN DELAROSA is a 61 y.o. male with medical history significant of IDDM type 1 on insulin pump, tobacco use, hyperlipidemia who presents to the emergency department due to complaint of elevated blood glucose level.  Patient states that he ran out off his insulin pump within the last 24 hours due to delay in insulin delivery. He presented with DKA and blood sugar >300.  He was admitted and placed on insulin drip.  2/13: Patient remains on insulin drip this morning.  No new complaints on examination.  His family stated that his insulin should arrive at home by Wednesday.  Assessment and Plan: * DKA (diabetic ketoacidosis) (HCC)- (present on admission) Transition to [facility], NovoLog and sliding scale today.  He will need subcutaneous insulin for discharge until he is able to get his insulin supplies Appreciate diabetic coordinator   Leukocytosis Initially thought to be a reactive process but patient with continued elevated WBC.  Repeat labs in the morning.  UA is unimpressive.  Obtain blood cultures.  HTN (hypertension) Lisinopril   Tobacco abuse- (present on admission) Patient was counseled on tobacco use cessation  HLD (hyperlipidemia)- (present on admission) Crestor          Physical Exam: Vitals:   03/13/21 1100 03/13/21 1159 03/13/21 1200 03/13/21 1300  BP: (!) 100/31  (!) 103/49 (!) 126/36  Pulse: 71  81 82  Resp: (!) 22  18 16   Temp:  97.8 F (36.6 C)    TempSrc:  Oral    SpO2: 98%  100% 100%  Weight:      Height:       Examination: General exam: Appears calm and comfortable  Respiratory system: Clear to auscultation. Respiratory effort normal. Cardiovascular system: S1 & S2 heard, RRR. No pedal edema. Gastrointestinal system: Abdomen is nondistended, soft and nontender.  Normal bowel sounds heard. Central nervous system: Alert and oriented. Non focal exam. Speech clear  Extremities: Symmetric in appearance bilaterally  Skin: No rashes, lesions or ulcers on exposed skin  Psychiatry: Judgement and insight appear stable. Mood & affect appropriate.    Data Reviewed:   Repeat BMP evaluated, anion gap closed at 9 around 4:38 AM.  Most recent CO2 22.  WBC remains elevated 19.6.  Most recent blood sugar 163.  UA unimpressive for UTI.    Family Communication: None at bedside  Disposition: Status is: Observation The patient will require care spanning > 2 midnights and should be moved to inpatient because: elevated WBC, blood cultures pending.     Planned Discharge Destination: Home      Author: , DO 03/13/2021 2:15 PM  For on call review [email or web address].

## 2021-03-13 NOTE — Assessment & Plan Note (Signed)
Lisinopril 

## 2021-03-13 NOTE — Assessment & Plan Note (Signed)
Patient was counseled on tobacco use cessation

## 2021-03-13 NOTE — Assessment & Plan Note (Addendum)
Likely a reactive process, improved.  Afebrile.  UA negative.  Blood cultures pending.

## 2021-03-13 NOTE — ED Notes (Signed)
Dr Adefeso at bedside. 

## 2021-03-13 NOTE — Assessment & Plan Note (Addendum)
Appreciate diabetic coordinator Transition to Lantus and NovoLog for discharge.  He stated that he is supposed to be getting his home insulin pump supplies 2/15.

## 2021-03-13 NOTE — TOC Progression Note (Signed)
°  Transition of Care [facility]) Screening Note   Patient Details  Name: Ronald Wilkinson Date of Birth: [birth year]   Transition of Care [facility]) CM/SW Contact:    Elliot Gault, LCSW Phone Number: 03/13/2021, 10:20 AM    Transition of Care Department [facility]) has reviewed patient and no TOC needs have been identified at this time. We will continue to monitor patient advancement through interdisciplinary progression rounds. If new patient transition needs arise, please place a TOC consult.

## 2021-03-13 NOTE — Hospital Course (Addendum)
Ronald Wilkinson is a 61 y.o. male with medical history significant of IDDM type 1 on insulin pump, tobacco use, hyperlipidemia who presents to the emergency department due to complaint of elevated blood glucose level.  Patient states that he ran out off his insulin pump within the last 24 hours due to delay in insulin delivery. He presented with DKA and blood sugar >300.  He was admitted and placed on insulin drip. Patient's blood sugar improved and he was transition to subcutaneous insulin.  Diabetic coordinator was consulted.  Leukocytosis improved, likely was a reactive process without suspected infection.

## 2021-03-13 NOTE — Assessment & Plan Note (Deleted)
Continue treatment as described for DKA °

## 2021-03-13 NOTE — H&P (Signed)
History and Physical    Patient: Ronald Wilkinson [id number] DOB: 07/05/1960 DOA: 03/12/2021 DOS: the patient was seen and examined on 03/13/2021 PCP: Carylon Perches, MD  Patient coming from: Home  Chief Complaint:  Chief Complaint  Patient presents with   Hyperglycemia    HPI: Ronald Wilkinson is a 61 y.o. male with medical history significant of IDDM on insulin pump, tobacco use, hyperlipidemia who presents to the emergency department due to complaint of elevated blood glucose level.  Patient states that he ran out off his insulin pump within the last 24 hours due to delay in insulin delivery, he was using subcu insulin without good control of his blood glucose level, he complained of feeling weak and having diffuse body aches, excessive sweating, vomiting x1.  He denies nausea, fever, chills, headache, chest pain, shortness of breath, palpitations, diarrhea or constipation.  ED course: In the emergency department, he was hemodynamically stable.  Work-up in the ED showed leukocytosis, hyponatremia, hyperglycemia, anion gap 28, beta hydroxybutyrate acid 2.33, total CK 43.  Influenza A, B, SARS coronavirus 2 was negative. Chest x-ray showed no active disease Patient was started on DKA protocol per Endo tool.  Hospitalist was asked to admit patient for further evaluation and management.  Review of Systems: As mentioned in the history of present illness. All other systems reviewed and are negative. Past Medical History:  Diagnosis Date   HYPERCHOLESTEROLEMIA 12/27/2009   IDDM 04/25/2009   SMOKER 04/25/2009   Past Surgical History:  Procedure Laterality Date   COLONOSCOPY N/A 10/29/2012   Procedure: COLONOSCOPY;  Surgeon: Malissa Hippo, MD;  Location: [facility];  Service: Endoscopy;  Laterality: N/A;  730   OPEN REDUCTION INTERNAL FIXATION (ORIF) HAND Right    Social History:  reports that he has been smoking cigarettes. He has a 20.50 pack-year smoking history. He has  never used smokeless tobacco. He reports current alcohol use. He reports that he does not use drugs.  No Known Allergies  Family History  Problem Relation Age of Onset   Diabetes Mother    Diabetes Father     Prior to Admission medications   Medication Sig Start Date End Date Taking? Authorizing Provider  aspirin EC 81 MG tablet Take 81 mg by mouth daily.    [provider]  Cholecalciferol (VITAMIN D-3) 5000 UNITS TABS Take 5,000 Units by mouth every other day.     [provider]  Glucos-Chond-Hyal Ac-Ca Fructo (MOVE FREE JOINT HEALTH ADVANCE PO) Take 1 tablet by mouth daily.    [provider]  ibuprofen (ADVIL,MOTRIN) 200 MG tablet Take 800 mg by mouth daily.    [provider]  insulin lispro (HUMALOG) 100 UNIT/ML cartridge Inject into the skin. Insulin pump    [provider]  lisinopril (ZESTRIL) 10 MG tablet Take 10 mg by mouth daily.    [provider]  Multiple Vitamin (MULTIVITAMIN WITH MINERALS) TABS tablet Take 1 tablet by mouth daily.    [provider]  ondansetron (ZOFRAN) 8 MG tablet Take 1 tablet (8 mg total) by mouth every 8 (eight) hours as needed for nausea or vomiting. 12/30/20   Mancel Bale, MD  simvastatin (ZOCOR) 20 MG tablet Take 20 mg by mouth every evening.    [provider]    Physical Exam: Vitals:   03/12/21 2254 03/13/21 0000 03/13/21 0030  BP: 120/69 (!) 124/49 (!) 120/91  Pulse: 92 82 66  Resp: 18 15 16   Temp: 98.9 F (  37.2 C)    TempSrc: Oral    SpO2: 100% 100% 100%  Weight: 70.9 kg    Height: 6' (1.829 m)     General: Patient was awake and alert and oriented x3.  He was not in any acute distress.  HEENT: NCAT.  PERRLA. EOMI. Sclerae anicteric.  Dry mucosal membranes. Neck: Neck supple without lymphadenopathy. No carotid bruits. No masses palpated.  Cardiovascular: Regular rate with normal S1-S2 sounds. No murmurs, rubs or gallops auscultated. No JVD.  Respiratory:  Clear breath sounds.  No accessory muscle use. Abdomen: Soft, nontender, nondistended. Active bowel sounds. No masses or hepatosplenomegaly  Skin: No rashes, lesions, or ulcerations.  Dry, warm to touch. Musculoskeletal:  2+ dorsalis pedis and radial pulses. Good ROM.  No contractures  Psychiatric: Intact judgment and insight.  Mood appropriate to current condition. Neurologic: No focal neurological deficits. Strength is 5/5 x 4.  CN II - XII grossly intact.   Data Reviewed: Normal sinus rhythm at a rate of 66 bpm with VPCs  Assessment and Plan: * DKA (diabetic ketoacidosis) (HCC)- (present on admission) Anion gap metabolic acidosis secondary to DKA Hyperglycemia secondary to poorly controlled type 2 diabetes mellitus Continue insulin drip, IV LR with IV potassium per DKA protocol Transition IV LR to D5LR when serum glucose reaches 250mg /dL Continue serial BMP and VBG Continue to monitor for anion gap closure prior to transitioning patient to subcu insulin Continue NPO   Hyperglycemia due to diabetes mellitus (HCC) Continue treatment as described for DKA   Hyponatremia Na 131, this was possibly induced by hyperglycemia Corrected sodium level based on hyperglycemia is 137 Continue to monitor sodium level with morning labs  Leukocytosis WBC 22.8, this is possibly a reactive process Continue to monitor WBC with morning labs  HYPERCHOLESTEROLEMIA- (present on admission) Continue Zocor  SMOKER- (present on admission) Patient was counseled on tobacco use cessation   Advance Care Planning:   Code Status: Not on file Full Code  Consults: None  Family Communication: None at bedside  Severity of Illness: The appropriate patient status for this patient is OBSERVATION. Observation status is judged to be reasonable and necessary in order to provide the required intensity of service to ensure the patient's safety. The patient's presenting symptoms, physical exam findings, and  initial radiographic and laboratory data in the context of their medical condition is felt to place them at decreased risk for further clinical deterioration. Furthermore, it is anticipated that the patient will be medically stable for discharge from the hospital within 2 midnights of admission.   Author: , DO 03/13/2021 2:44 AM  For on call review [email or web address].

## 2021-03-14 DIAGNOSIS — E1010 Type 1 diabetes mellitus with ketoacidosis without coma: Secondary | ICD-10-CM | POA: Diagnosis not present

## 2021-03-14 LAB — BASIC METABOLIC PANEL
Anion gap: 13 (ref 5–15)
BUN: 16 mg/dL (ref 6–20)
CO2: 19 mmol/L — ABNORMAL LOW (ref 22–32)
Calcium: 8.5 mg/dL — ABNORMAL LOW (ref 8.9–10.3)
Chloride: 104 mmol/L (ref 98–111)
Creatinine, Ser: 0.63 mg/dL (ref 0.61–1.24)
GFR, Estimated: >60 mL/min (ref >60)
Glucose, Bld: 274 mg/dL — ABNORMAL HIGH (ref 70–99)
Potassium: 4.1 mmol/L (ref 3.5–5.1)
Sodium: 136 mmol/L (ref 135–145)

## 2021-03-14 LAB — CBC
HCT: 36 % — ABNORMAL LOW (ref 39.0–52.0)
Hemoglobin: 12 g/dL — ABNORMAL LOW (ref 13.0–17.0)
MCH: 28.9 pg (ref 26.0–34.0)
MCHC: 33.3 g/dL (ref 30.0–36.0)
MCV: 86.7 fL (ref 80.0–100.0)
Platelets: 317 10*3/uL (ref 150–400)
RBC: 4.15 MIL/uL — ABNORMAL LOW (ref 4.22–5.81)
RDW: 13.3 % (ref 11.5–15.5)
WBC: 14 10*3/uL — ABNORMAL HIGH (ref 4.0–10.5)
nRBC: 0 % (ref 0.0–0.2)

## 2021-03-14 LAB — GLUCOSE, CAPILLARY
Glucose-Capillary: 312 mg/dL — ABNORMAL HIGH (ref 70–99)
Glucose-Capillary: 266 mg/dL — ABNORMAL HIGH (ref 70–99)
Glucose-Capillary: 231 mg/dL — ABNORMAL HIGH (ref 70–99)
Glucose-Capillary: 240 mg/dL — ABNORMAL HIGH (ref 70–99)
Glucose-Capillary: 212 mg/dL — ABNORMAL HIGH (ref 70–99)
Glucose-Capillary: 180 mg/dL — ABNORMAL HIGH (ref 70–99)
Glucose-Capillary: 145 mg/dL — ABNORMAL HIGH (ref 70–99)
Glucose-Capillary: 251 mg/dL — ABNORMAL HIGH (ref 70–99)
Glucose-Capillary: 249 mg/dL — ABNORMAL HIGH (ref 70–99)

## 2021-03-14 MED ORDER — INSULIN PEN NEEDLE 31G X 5 MM MISC: 0 refills | Status: AC

## 2021-03-14 MED ORDER — INSULIN GLARGINE 100 UNIT/ML SOLOSTAR PEN: 33.0000 [IU] | PEN_INJECTOR | Freq: Every day | SUBCUTANEOUS | 0 refills | Status: AC

## 2021-03-14 MED ORDER — INSULIN GLARGINE-YFGN 100 UNIT/ML ~~LOC~~ SOLN
5.0000 [IU] | Freq: Once | SUBCUTANEOUS | Status: AC
  Administered 2021-03-14: 5 [IU] via SUBCUTANEOUS
  Filled 2021-03-14: qty 0.05

## 2021-03-14 MED ORDER — INSULIN GLARGINE-YFGN 100 UNIT/ML ~~LOC~~ SOLN
33.0000 [IU] | Freq: Every day | SUBCUTANEOUS | Status: DC
  Filled 2021-03-14: qty 0.33

## 2021-03-14 MED ORDER — INSULIN ASPART 100 UNIT/ML IJ SOLN: 0.0000 [IU] | Freq: Every day | INTRAMUSCULAR | Status: DC

## 2021-03-14 MED ORDER — INSULIN LISPRO 200 UNIT/ML ~~LOC~~ SOPN: 4.0000 [IU] | PEN_INJECTOR | Freq: Three times a day (TID) | SUBCUTANEOUS | 0 refills | Status: AC

## 2021-03-14 NOTE — Discharge Summary (Signed)
Physician Discharge Summary   Patient: Ronald Wilkinson MRN: [id number] DOB: 13-Jul-1960  Admit date:     03/12/2021  Discharge date: 03/14/21  Discharge Physician: Noralee Stain   PCP: Carylon Perches, MD   Recommendations at discharge:    Follow up with PCP and endocrinology   Discharge Diagnoses: Principal Problem:   DKA (diabetic ketoacidosis) (HCC) Active Problems:   Leukocytosis   HLD (hyperlipidemia)   Tobacco abuse   HTN (hypertension)  Resolved Problems:   * No resolved hospital problems. *   Hospital Course: Ronald Wilkinson is a 61 y.o. male with medical history significant of IDDM type 1 on insulin pump, tobacco use, hyperlipidemia who presents to the emergency department due to complaint of elevated blood glucose level.  Patient states that he ran out off his insulin pump within the last 24 hours due to delay in insulin delivery. He presented with DKA and blood sugar >300.  He was admitted and placed on insulin drip. Patient's blood sugar improved and he was transition to subcutaneous insulin.  Diabetic coordinator was consulted.  Leukocytosis improved, likely was a reactive process without suspected infection.  Assessment and Plan: * DKA (diabetic ketoacidosis) (HCC)- (present on admission) Appreciate diabetic coordinator Transition to Lantus and NovoLog for discharge.  He stated that he is supposed to be getting his home insulin pump supplies 2/15.  Leukocytosis Likely a reactive process, improved.  Afebrile.  UA negative.  Blood cultures pending.  HTN (hypertension) Lisinopril   Tobacco abuse- (present on admission) Patient was counseled on tobacco use cessation  HLD (hyperlipidemia)- (present on admission) Crestor            Consultants: None Procedures performed: None   Disposition: Home Diet recommendation:  Carb modified diet  DISCHARGE MEDICATION: Allergies as of 03/14/2021   No Known Allergies      Medication List     TAKE  these medications    aspirin EC 81 MG tablet Take 81 mg by mouth daily.   ibuprofen 200 MG tablet Commonly known as: ADVIL Take 800 mg by mouth daily as needed for mild pain.   insulin glargine 100 UNIT/ML Solostar Pen Commonly known as: LANTUS Inject 33 Units into the skin daily. Until transition to home insulin pump. Start taking on: March 15, 2021   insulin lispro 100 UNIT/ML cartridge Commonly known as: HUMALOG Inject into the skin. Insulin pump What changed: Another medication with the same name was added. Make sure you understand how and when to take each.   insulin lispro 200 UNIT/ML KwikPen Commonly known as: HUMALOG Inject 4 Units into the skin 3 (three) times daily before meals. Until transition to insulin pump. What changed: You were already taking a medication with the same name, and this prescription was added. Make sure you understand how and when to take each.   Insulin Pen Needle 31G X 5 MM Misc Use with insulin pens   lisinopril 10 MG tablet Commonly known as: ZESTRIL Take 10 mg by mouth daily.   MOVE FREE JOINT HEALTH ADVANCE PO Take 1 tablet by mouth daily.   multivitamin with minerals Tabs tablet Take 1 tablet by mouth daily.   rosuvastatin 20 MG tablet Commonly known as: CRESTOR Take 20 mg by mouth at bedtime.   Vitamin D-3 125 MCG (5000 UT) Tabs Take 5,000 Units by mouth every other day.        Follow-up Information     Felicity Coyer, MD Follow up.   Specialty: Internal  Medicine Contact information: [address] [place] [address] [phone]                 Discharge Exam: Filed Weights   03/13/21 0245 03/14/21 0359 03/14/21 0531  Weight: 73.3 kg 74.2 kg 71.3 kg   Examination: General exam: Appears calm and comfortable  Respiratory system: Clear to auscultation. Respiratory effort normal. Cardiovascular system: S1 & S2 heard, RRR. No pedal edema. Gastrointestinal system: Abdomen is nondistended, soft  and nontender. Normal bowel sounds heard. Central nervous system: Alert and oriented. Non focal exam. Speech clear  Extremities: Symmetric in appearance bilaterally  Skin: No rashes, lesions or ulcers on exposed skin  Psychiatry: Judgement and insight appear stable. Mood & affect appropriate.    Condition at discharge: stable  The results of significant diagnostics from this hospitalization (including imaging, microbiology, ancillary and laboratory) are listed below for reference.   Imaging Studies: DG Chest Port 1 View  Result Date: 03/13/2021 CLINICAL DATA:  DKA EXAM: PORTABLE CHEST 1 VIEW COMPARISON:  12/30/2020 FINDINGS: Heart and mediastinal contours are within normal limits. No focal opacities or effusions. No acute bony abnormality. IMPRESSION: No active disease. Electronically Signed   By: Charlett Nose M.D.   On: 03/13/2021 01:34    Microbiology: Results for orders placed or performed during the hospital encounter of 03/12/21  Resp Panel by RT-PCR (Flu A&B, Covid) Nasopharyngeal Swab     Status: None   Collection Time: 03/12/21 11:34 PM   Specimen: Nasopharyngeal Swab; Nasopharyngeal(NP) swabs in vial transport medium  Result Value Ref Range Status   SARS Coronavirus 2 by RT PCR NEGATIVE NEGATIVE Final    Comment: (NOTE) SARS-CoV-2 target nucleic acids are NOT DETECTED.  The SARS-CoV-2 RNA is generally detectable in upper respiratory specimens during the acute phase of infection. The lowest concentration of SARS-CoV-2 viral copies this assay can detect is 138 copies/mL. A negative result does not preclude SARS-Cov-2 infection and should not be used as the sole basis for treatment or other patient management decisions. A negative result may occur with  improper specimen collection/handling, submission of specimen other than nasopharyngeal swab, presence of viral mutation(s) within the areas targeted by this assay, and inadequate number of viral copies(<138 copies/mL). A  negative result must be combined with clinical observations, patient history, and epidemiological information. The expected result is Negative.  Fact Sheet for Patients:  BloggerCourse.com  Fact Sheet for Healthcare Providers:  SeriousBroker.it  This test is no t yet approved or cleared by the [place] FDA and  has been authorized for detection and/or diagnosis of SARS-CoV-2 by FDA under an Emergency Use Authorization (EUA). This EUA will remain  in effect (meaning this test can be used) for the duration of the COVID-19 declaration under Section 564(b)(1) of the Act, 21 U.S.C.section 360bbb-3(b)(1), unless the authorization is terminated  or revoked sooner.       Influenza A by PCR NEGATIVE NEGATIVE Final   Influenza B by PCR NEGATIVE NEGATIVE Final    Comment: (NOTE) The Xpert Xpress SARS-CoV-2/FLU/RSV plus assay is intended as an aid in the diagnosis of influenza from Nasopharyngeal swab specimens and should not be used as a sole basis for treatment. Nasal washings and aspirates are unacceptable for Xpert Xpress SARS-CoV-2/FLU/RSV testing.  Fact Sheet for Patients: BloggerCourse.com  Fact Sheet for Healthcare Providers: SeriousBroker.it  This test is not yet approved or cleared by the [place] FDA and has been authorized for detection and/or diagnosis of SARS-CoV-2 by FDA under an  Emergency Use Authorization (EUA). This EUA will remain in effect (meaning this test can be used) for the duration of the COVID-19 declaration under Section 564(b)(1) of the Act, 21 U.S.C. section 360bbb-3(b)(1), unless the authorization is terminated or revoked.  Performed at [facility], [address] [place], [place] [address]   MRSA Next Gen by PCR, Nasal     Status: None   Collection Time: 03/13/21  2:46 AM   Specimen: Nasal Mucosa; Nasal Swab  Result Value Ref Range Status    MRSA by PCR Next Gen NOT DETECTED NOT DETECTED Final    Comment: (NOTE) The GeneXpert MRSA Assay (FDA approved for NASAL specimens only), is one component of a comprehensive MRSA colonization surveillance program. It is not intended to diagnose MRSA infection nor to guide or monitor treatment for MRSA infections. Test performance is not FDA approved in patients less than 60 years old. Performed at [facility], [address] [place], [place] [address]   Culture, blood (routine x 2)     Status: None (Preliminary result)   Collection Time: 03/13/21  2:34 PM   Specimen: BLOOD RIGHT HAND  Result Value Ref Range Status   Specimen Description BLOOD RIGHT HAND  Final   Special Requests   Final    BOTTLES DRAWN AEROBIC AND ANAEROBIC Blood Culture adequate volume Performed at [facility], [address] [place], [place] [address]    Culture PENDING  Incomplete   Report Status PENDING  Incomplete  Culture, blood (routine x 2)     Status: None (Preliminary result)   Collection Time: 03/13/21  2:34 PM   Specimen: BLOOD LEFT ARM  Result Value Ref Range Status   Specimen Description BLOOD LEFT ARM  Final   Special Requests   Final    BOTTLES DRAWN AEROBIC AND ANAEROBIC Blood Culture adequate volume Performed at [facility], [address] [place], [place] [address]    Culture PENDING  Incomplete   Report Status PENDING  Incomplete    Labs: CBC: Recent Labs  Lab 03/12/21 2322 03/13/21 0438 03/13/21 1258 03/14/21 0432  WBC 22.8* 17.7* 19.6* 14.0*  HGB 14.4 11.8* 12.8* 12.0*  HCT 43.8 35.9* 38.1* 36.0*  MCV 90.1 87.3 89.0 86.7  PLT 449* 341 383 317   Basic Metabolic Panel: Recent Labs  Lab 03/13/21 0438 03/13/21 0835 03/13/21 1258 03/13/21 1434 03/14/21 0432  NA 131* 135 135 135 136  K 3.6 4.2 4.0 3.7 4.1  CL 102 104 104 103 104  CO2 20* 23 22 24  19*  GLUCOSE 257* 183* 163* 216* 274*  BUN 25* 21* 18 20 16   CREATININE 0.76 0.66 0.58* 0.81 0.63  CALCIUM 8.9 9.0 9.0 8.8*  8.5*  MG 1.8  --   --   --   --   PHOS 3.4  --   --   --   --    Liver Function Tests: Recent Labs  Lab 03/12/21 2322 03/13/21 0438  AST 26 21  ALT 21 20  ALKPHOS 119 94  BILITOT 1.7* 0.9  PROT 7.3 6.1*  ALBUMIN 4.3 3.5   CBG: Recent Labs  Lab 03/13/21 1559 03/13/21 1932 03/13/21 2350 03/14/21 0403 03/14/21 0732  GLUCAP 229* 361* 289* 251* 249*    Discharge time spent: less than 30 minutes.  Signed: 03/16/21, DO [facility] 03/14/2021

## 2021-03-14 NOTE — Progress Notes (Signed)
Patient transferred to 308. Patient Alert & oriented, he notified his wife of transfer. No pain at this time. Understands plan of care. All needs addressed at this time.

## 2021-03-14 NOTE — Progress Notes (Addendum)
Inpatient Diabetes Program Recommendations  AACE/ADA: New Consensus Statement on Inpatient Glycemic Control   Target Ranges:  Prepandial:   less than 140 mg/dL      Peak postprandial:   less than 180 mg/dL (1-2 hours)      Critically ill patients:  140 - 180 mg/dL    Latest Reference Range & Units 03/14/21 04:03 03/14/21 07:32  Glucose-Capillary 70 - 99 mg/dL 793 (H) 903 (H)  Novolog 7 units  Semglee 28 units    Latest Reference Range & Units 03/13/21 10:45 03/13/21 11:58 03/13/21 13:07 03/13/21 13:18 03/13/21 15:59 03/13/21 19:32 03/13/21 23:50  Glucose-Capillary 70 - 99 mg/dL 009 (H) 233 (H)  Novolog 1 unit    Semglee 28 units 229 (H)  Novolog 7 units 361 (H) 289 (H)   Review of Glycemic Control  Diabetes history: DM1 Outpatient Diabetes medications: OmniPod insulin pump with Humalog insulin (35.7 units of basal/24 hours, 1 unit for every 10 grams of carbs, 1 unit per 150 mg/dl above target glucose of 130 mg/dl) Current orders for Inpatient glycemic control: Semglee 28 units daily, Novolog 0-9 units TID with meals, Novolog 4 units TID with meals  Inpatient Diabetes Program Recommendations:    Insulin: Please consider increasing Semglee to 33 units daily to start 03/15/21 (order one time Semglee 5 units x1  now for total of 33 units today) and adding Novolog 0-5 units QHS for bedtime correction.  Discharge: If patient will be discharged on SQ insulin regimen, please provide Rx for Lantus. Patient will need basal insulin and can use Humalog insulin for correction and meal coverage.  Thanks, Orlando Penner, RN, MSN, CDE Diabetes Coordinator Inpatient Diabetes Program [phone] (Team Pager from 8am to 5pm)

## 2021-03-18 LAB — CULTURE, BLOOD (ROUTINE X 2)
Culture: NO GROWTH
Culture: NO GROWTH
Special Requests: ADEQUATE
Special Requests: ADEQUATE

## 2021-08-27 IMAGING — CT CT CHEST LUNG CANCER SCREENING LOW DOSE W/O CM
2 of 3 series · 15 of 36 positions shown, 18 images · non-contrast
Comparison: None.

CLINICAL DATA: 58-year-old male with 62 pack-year history of
smoking. Lung cancer screening.

EXAM:
CT CHEST WITHOUT CONTRAST LOW-DOSE FOR LUNG CANCER SCREENING
TECHNIQUE: Multidetector CT imaging of the chest was performed following the
standard protocol without IV contrast.

[Series 2: axial st · axial · 0.74mm/px · z∈[+1136,+1436]mm · 12 of 72 slices shown, 15 images]
[im 6/72  mediastinal]
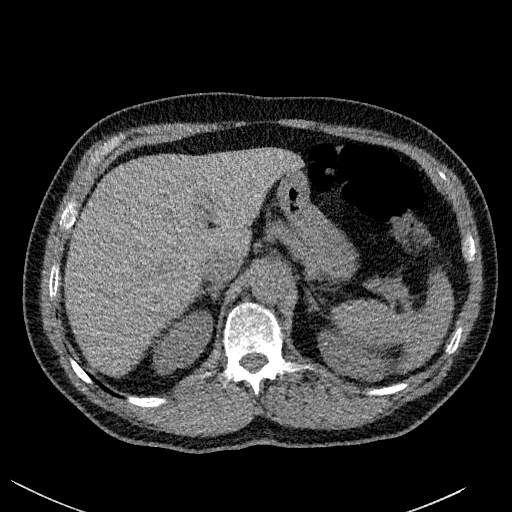
[im 6/72  lung]
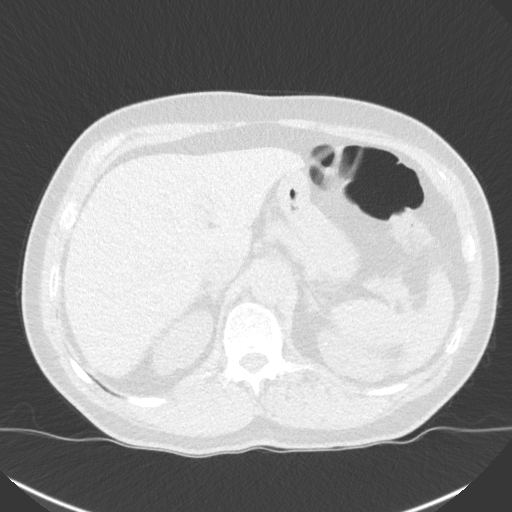
[im 11/72  lung]
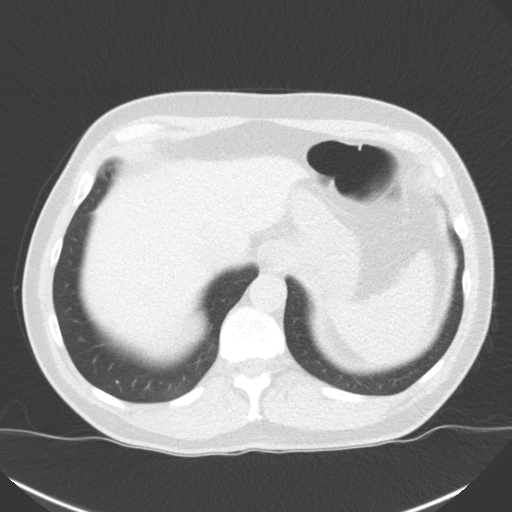
[im 16/72  lung]
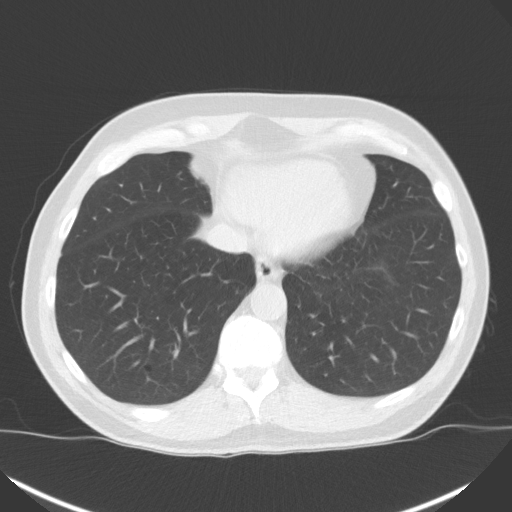
[im 22/72  lung]
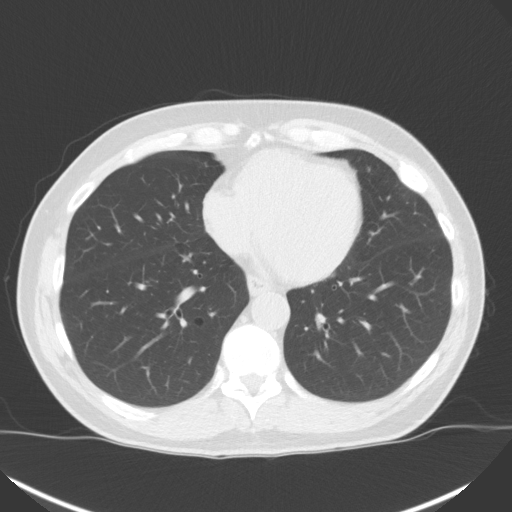
[im 27/72  mediastinal]
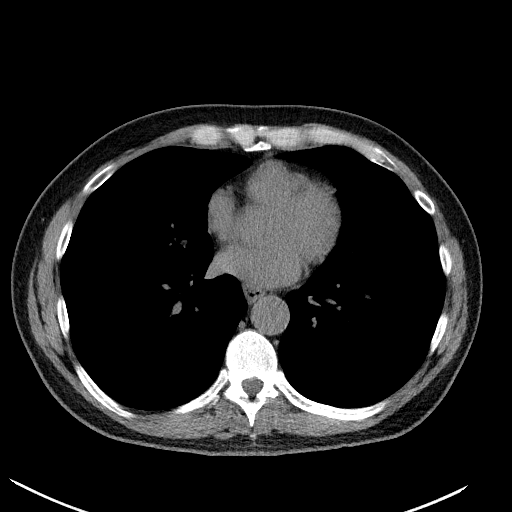
[im 27/72  lung]
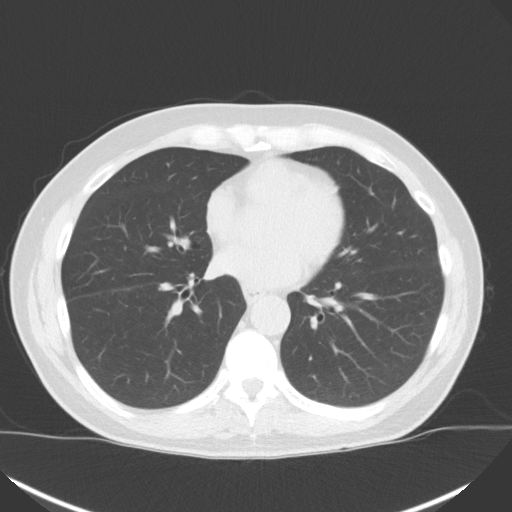
[im 32/72  lung]
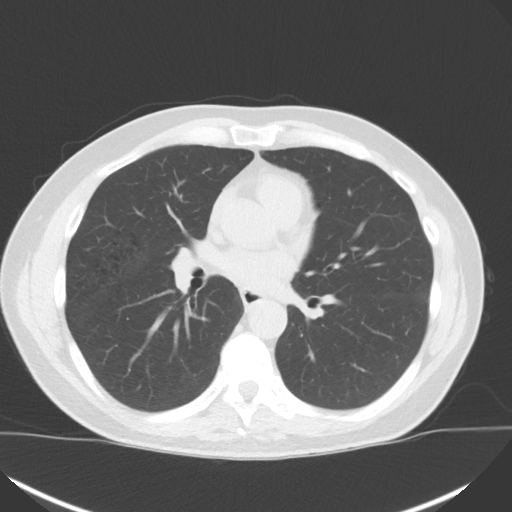
[im 40/72  lung]
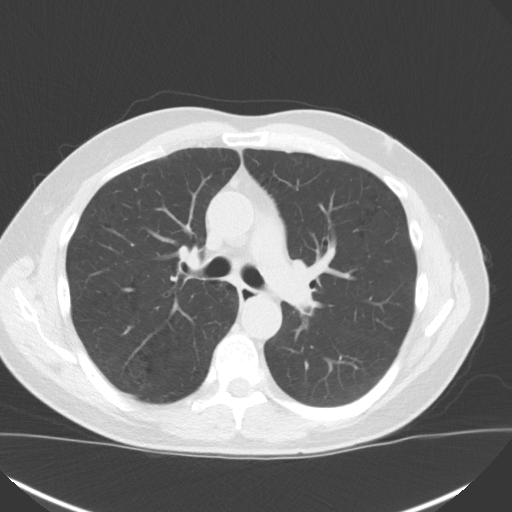
[im 45/72  lung]
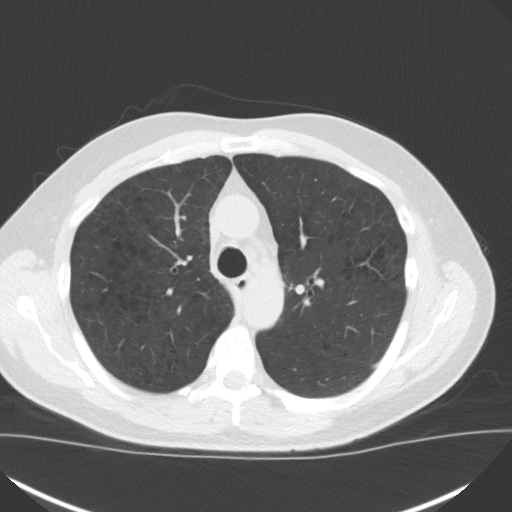
[im 50/72  mediastinal]
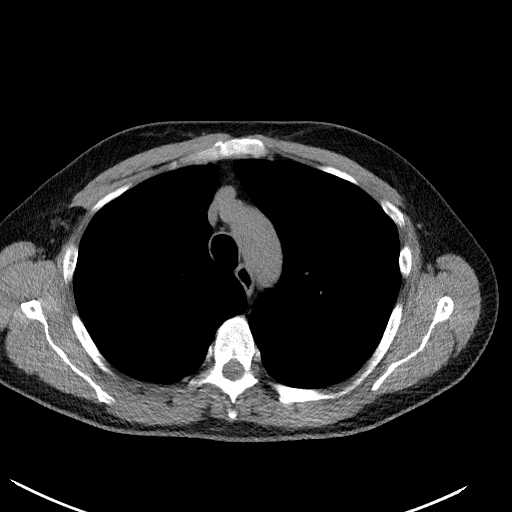
[im 50/72  lung]
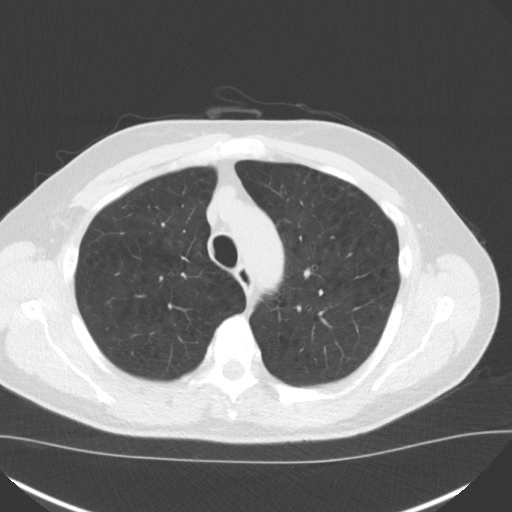
[im 56/72  lung]
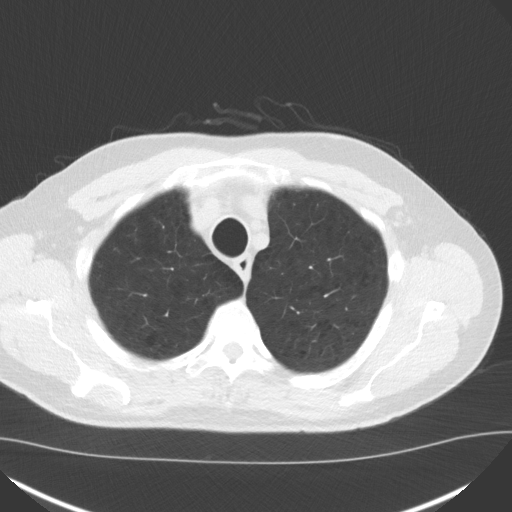
[im 61/72  lung]
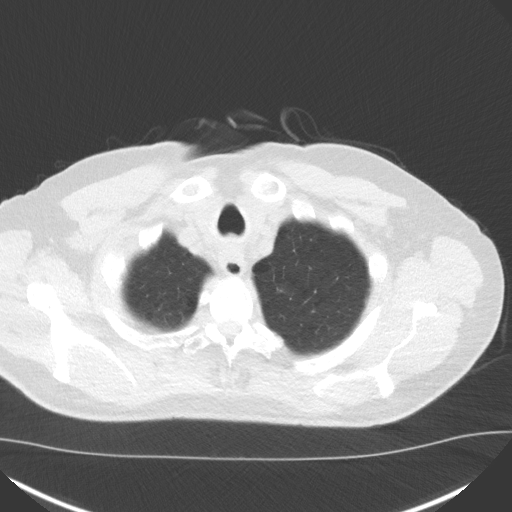
[im 66/72  lung]
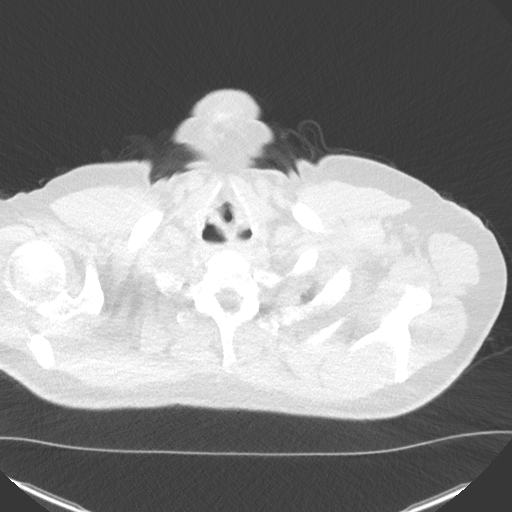

[Series 5: coronal · coronal · 0.71mm/px · 3 of 251 slices shown]
[im 51/251  lung]
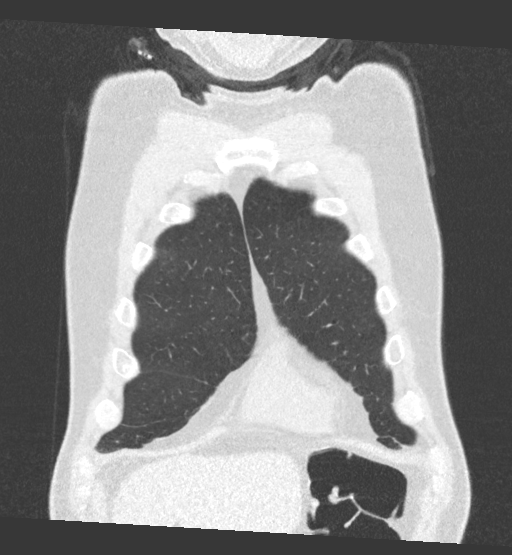
[im 101/251  lung]
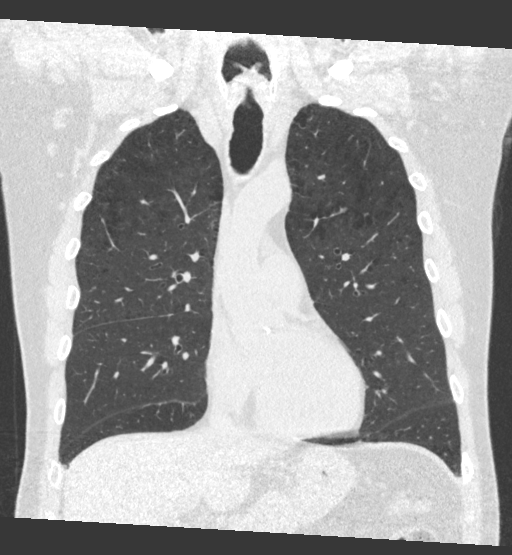
[im 151/251  lung]
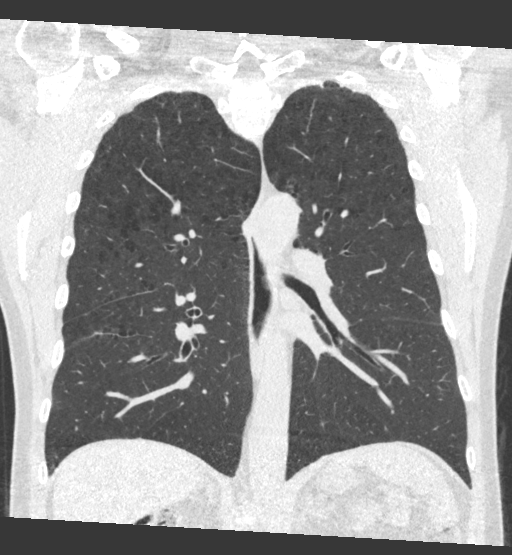

[15 of 36 positions shown; findings below may reference images not displayed]

FINDINGS: Cardiovascular: The heart size is normal. No substantial pericardial
effusion. Atherosclerotic calcification is noted in the wall of the
thoracic aorta.

Mediastinum/Nodes: No mediastinal lymphadenopathy. No evidence for
gross hilar lymphadenopathy although assessment is limited by the
lack of intravenous contrast on today's study. The esophagus has
normal imaging features. There is no axillary lymphadenopathy.

Lungs/Pleura: Centrilobular and paraseptal emphysema evident.
Calcified granulomata are noted bilaterally. Tiny noncalcified left
lower lobe pulmonary nodule has volume derived equivalent diameter
of 1.6 mm. No suspicious pulmonary nodule or mass. No focal airspace
consolidation. No pleural effusion.

Upper Abdomen: Unremarkable.

Musculoskeletal: No worrisome lytic or sclerotic osseous
abnormality.
IMPRESSION: Lung-RADS Category 2, benign appearance or behavior. Continue annual
screening with low-dose chest CT without contrast in 12 months.

Emphysema (G3MPU-JQC.M) and Aortic Atherosclerosis (G3MPU-170.0)

## 2022-04-02 ENCOUNTER — Other Ambulatory Visit (HOSPITAL_COMMUNITY): Payer: Self-pay | Admitting: Internal Medicine

## 2022-04-02 DIAGNOSIS — F17200 Nicotine dependence, unspecified, uncomplicated: Secondary | ICD-10-CM

## 2022-05-10 ENCOUNTER — Ambulatory Visit (HOSPITAL_COMMUNITY): Admission: RE | Admit: 2022-05-10 | Payer: BC Managed Care – PPO | Source: Ambulatory Visit

## 2022-05-15 ENCOUNTER — Encounter (HOSPITAL_COMMUNITY): Payer: Self-pay | Admitting: Emergency Medicine

## 2022-06-22 ENCOUNTER — Ambulatory Visit (HOSPITAL_COMMUNITY)
Admission: RE | Admit: 2022-06-22 | Discharge: 2022-06-22 | Disposition: A | Discharge status: Home / Self Care | Payer: BC Managed Care – PPO | Source: Ambulatory Visit | Attending: Internal Medicine | Admitting: Internal Medicine

## 2022-06-22 DIAGNOSIS — F17200 Nicotine dependence, unspecified, uncomplicated: Secondary | ICD-10-CM

## 2022-06-29 ENCOUNTER — Encounter (HOSPITAL_COMMUNITY): Payer: Self-pay

## 2022-06-29 ENCOUNTER — Ambulatory Visit (HOSPITAL_COMMUNITY): Admission: RE | Admit: 2022-06-29 | Payer: BC Managed Care – PPO | Source: Ambulatory Visit

## 2022-08-08 ENCOUNTER — Ambulatory Visit (HOSPITAL_COMMUNITY): Payer: Self-pay

## 2022-11-08 ENCOUNTER — Other Ambulatory Visit (HOSPITAL_COMMUNITY): Payer: Self-pay | Admitting: Internal Medicine

## 2022-11-08 DIAGNOSIS — F17200 Nicotine dependence, unspecified, uncomplicated: Secondary | ICD-10-CM

## 2022-12-13 ENCOUNTER — Ambulatory Visit (HOSPITAL_COMMUNITY)
Admission: RE | Admit: 2022-12-13 | Discharge: 2022-12-13 | Disposition: A | Discharge status: Home / Self Care | Payer: BC Managed Care – PPO | Source: Ambulatory Visit | Attending: Internal Medicine | Admitting: Internal Medicine

## 2022-12-13 DIAGNOSIS — F17200 Nicotine dependence, unspecified, uncomplicated: Secondary | ICD-10-CM | POA: Diagnosis present

## 2022-12-20 ENCOUNTER — Ambulatory Visit (HOSPITAL_COMMUNITY): Payer: BC Managed Care – PPO

## 2023-05-01 IMAGING — DX DG CHEST 1V PORT
1 series · 2 of 2 positions shown · non-contrast
Comparison: Chest x-ray 09/25/2015, CT chest 04/28/2019

CLINICAL DATA: Cough, chest pain.  Body aches.

EXAM:
PORTABLE CHEST 1 VIEW

[Series 1: chest ap · 0.14mm/px · 2 of 2 slices shown]
[im 1/2]
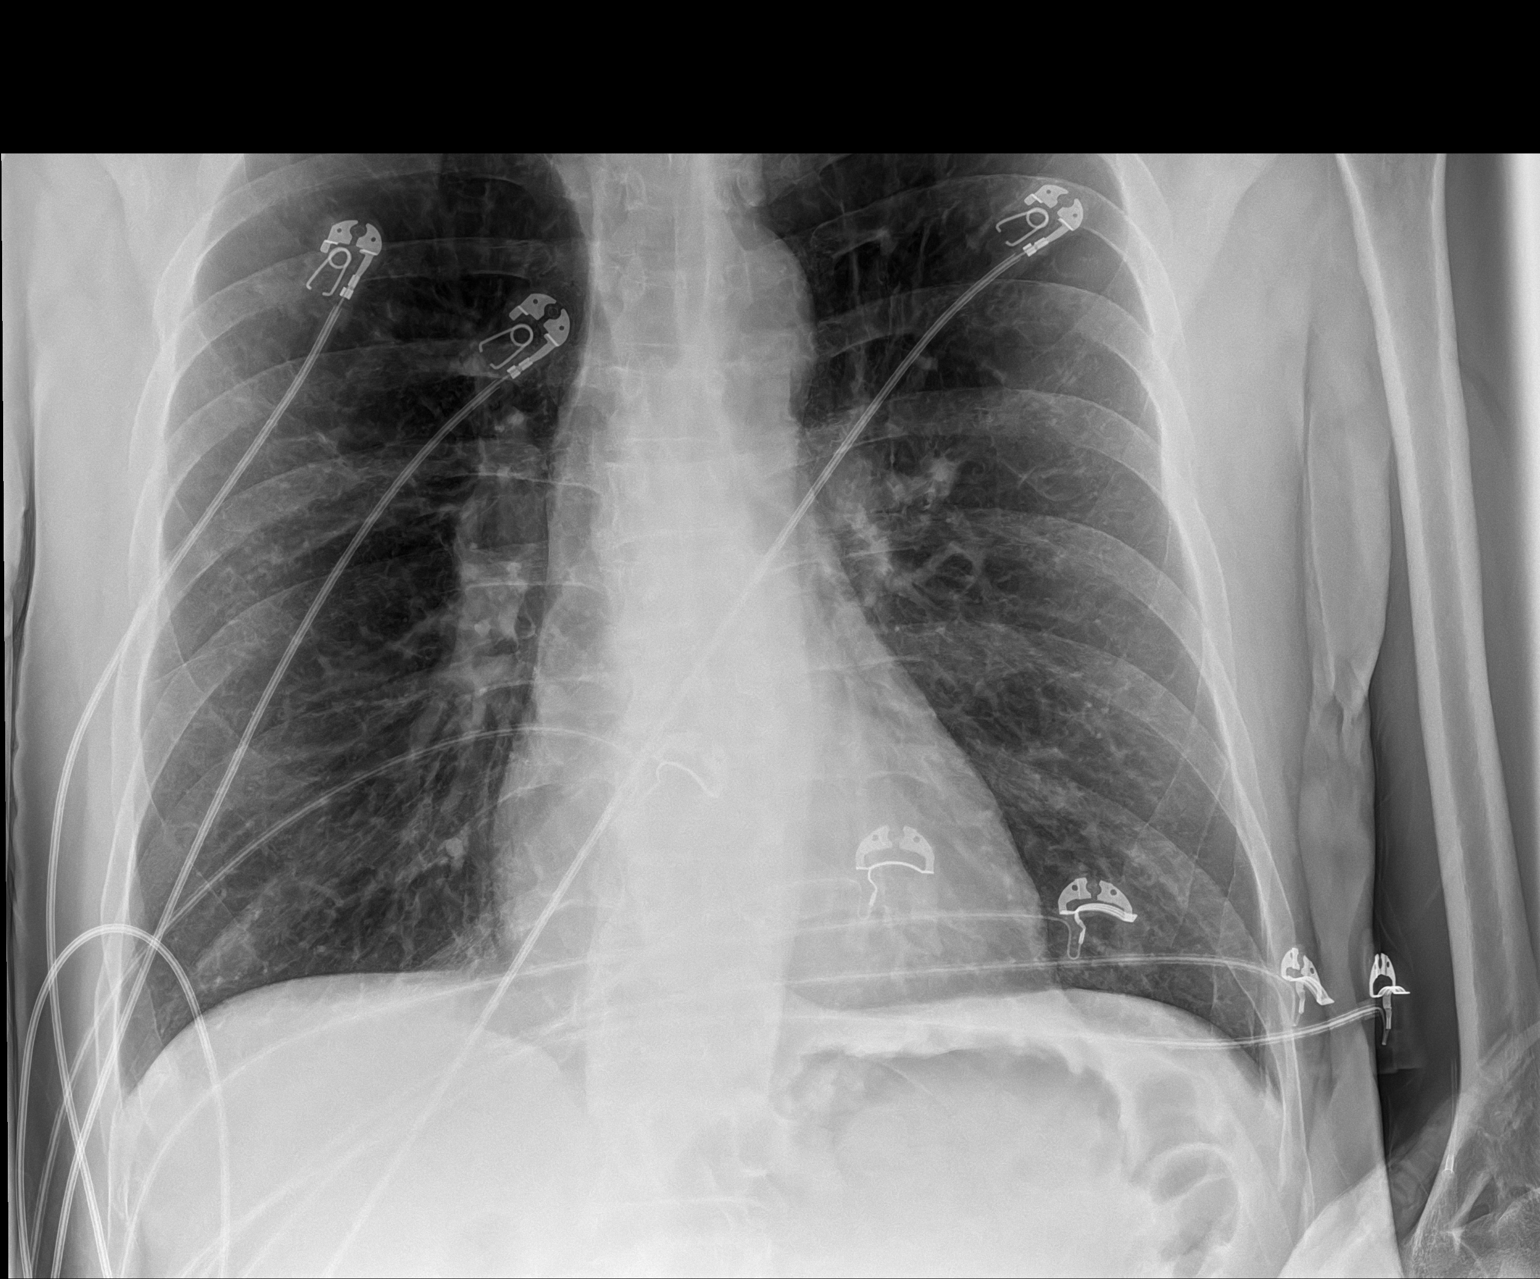
[im 2/2]
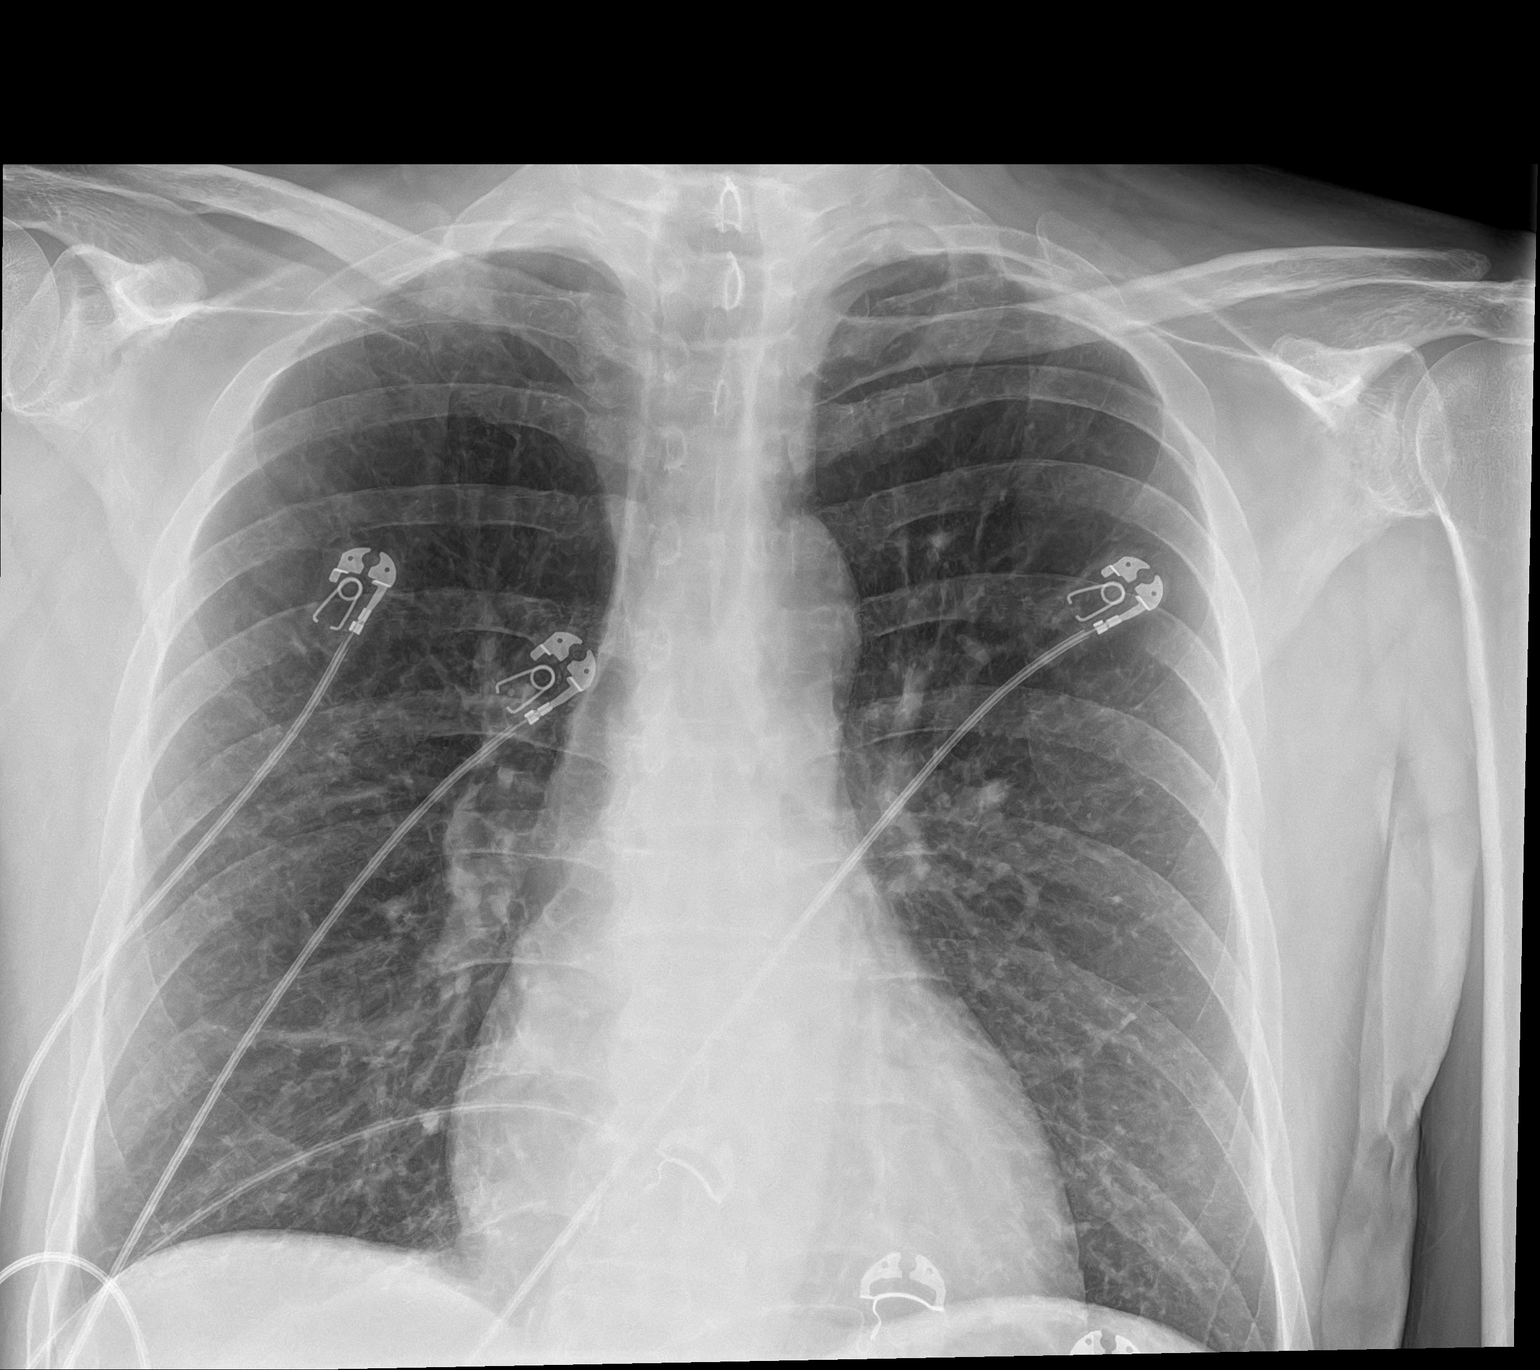

[2 of 2 positions shown; findings below may reference images not displayed]

FINDINGS: The heart and mediastinal contours are unchanged.

Bilateral lower lobe vague airspace opacities likely represent
nipple shadows. No focal consolidation. Coarsened interstitial
markings with no overt pulmonary edema. No pleural effusion. No
pneumothorax.

No acute osseous abnormality.
IMPRESSION: 1. Bilateral lower lobe vague airspace opacities likely represent
nipple shadows. Recommend repeat frontal view with nipple markers.
2.  Emphysema ([id number]).

## 2023-07-13 IMAGING — DX DG CHEST 1V PORT
1 series · 2 of 2 positions shown · non-contrast
Comparison: 12/30/2020

CLINICAL DATA: DKA

EXAM:
PORTABLE CHEST 1 VIEW

[Series 1: chest ap · 0.14mm/px · 2 of 2 slices shown]
[im 1/2]
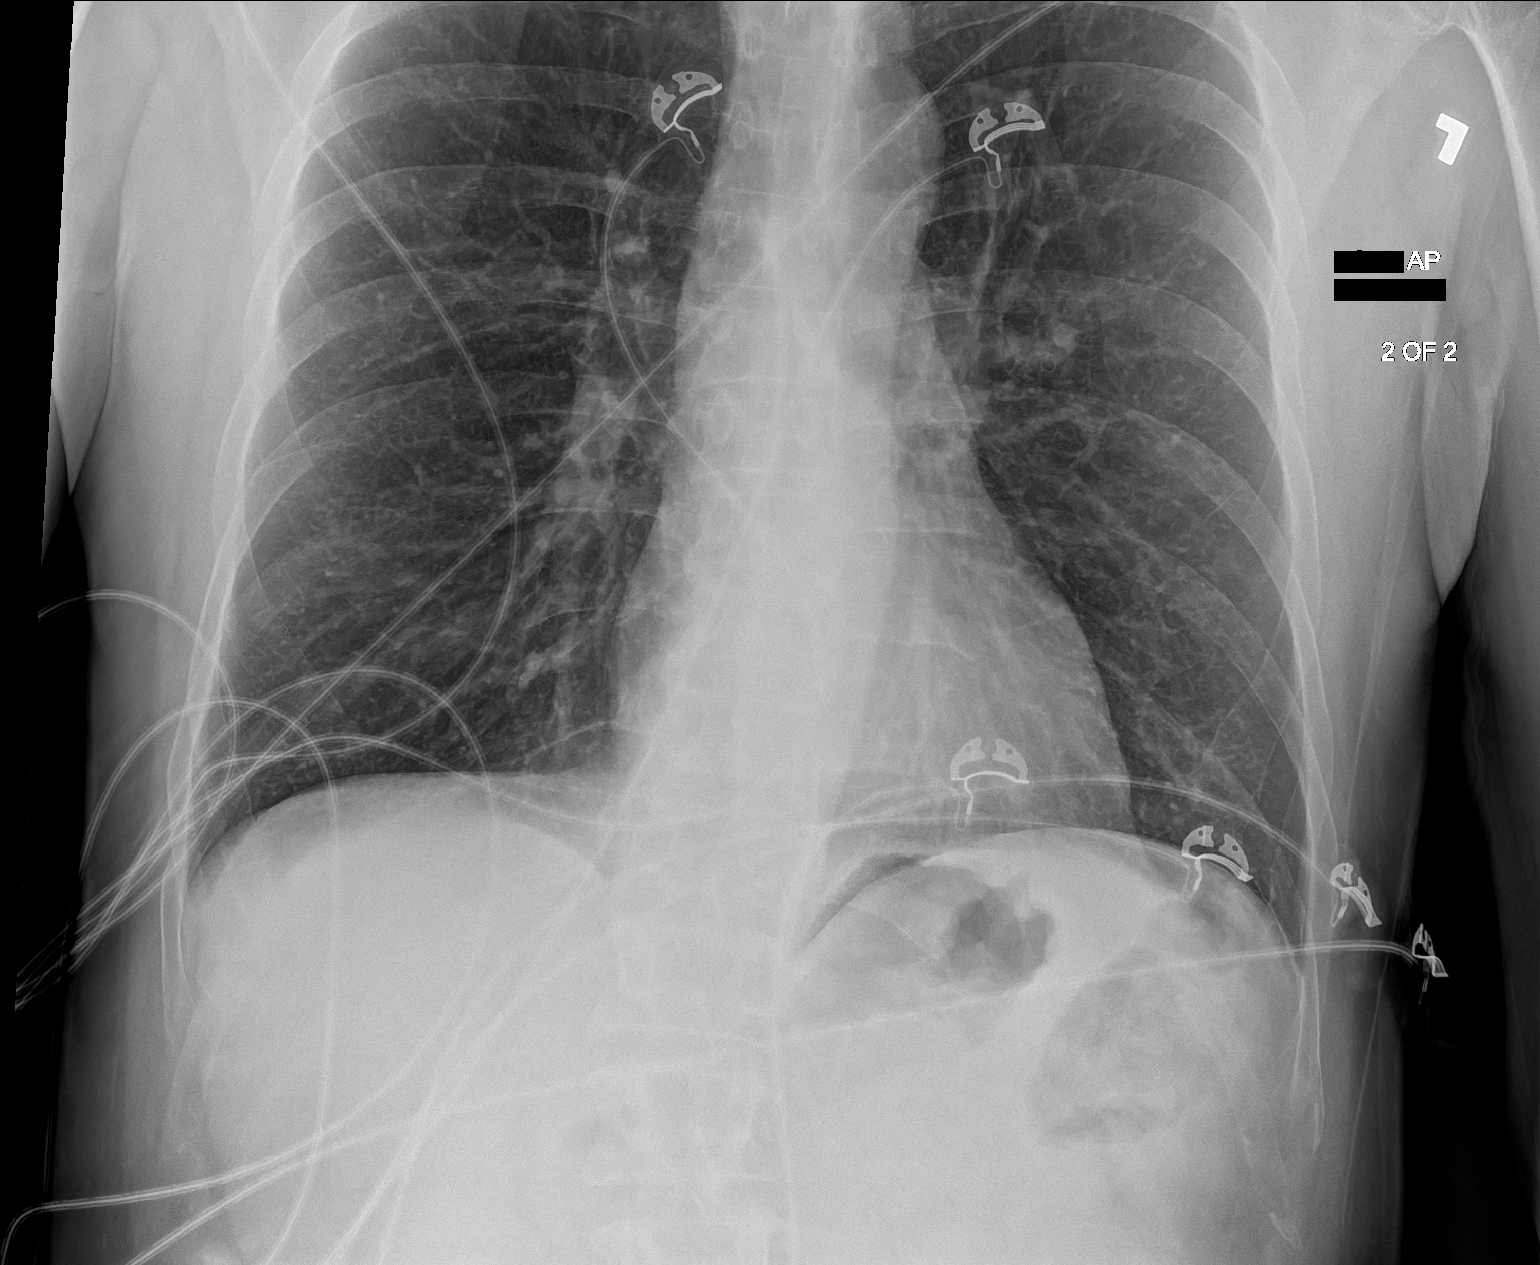
[im 2/2]
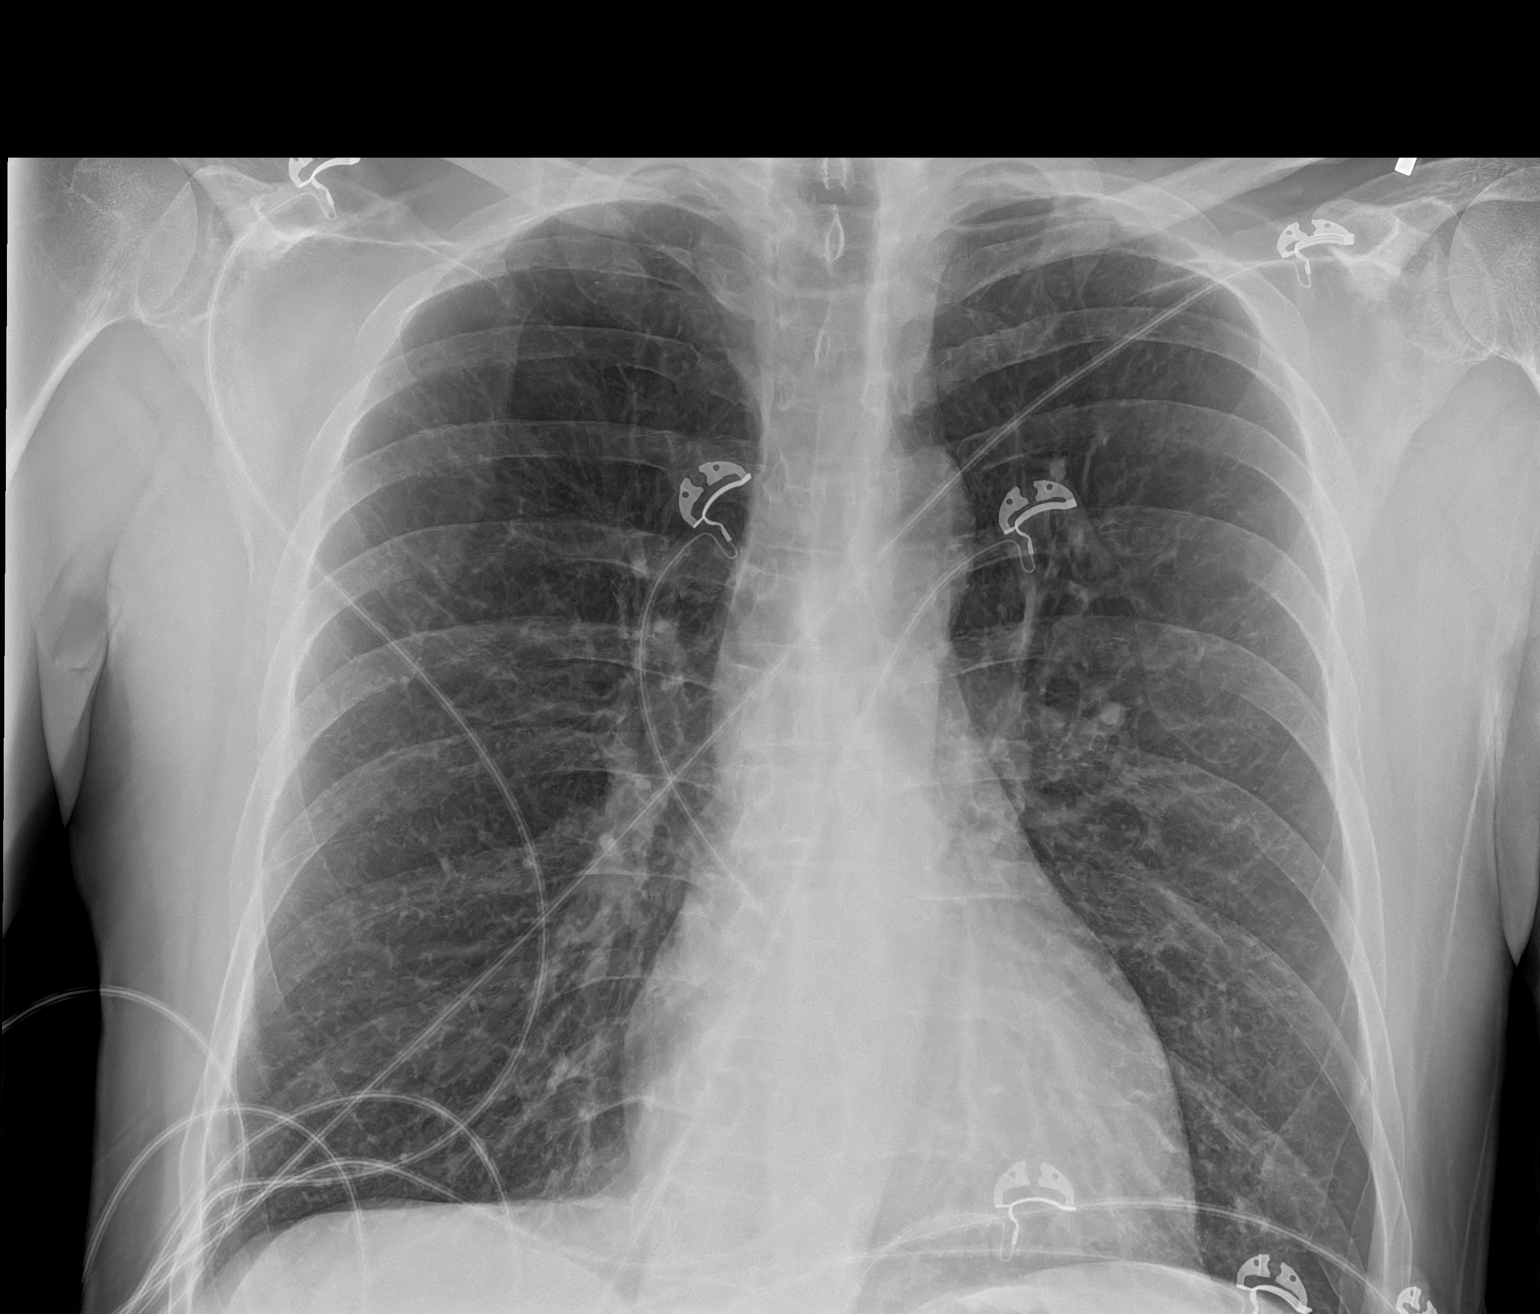

[2 of 2 positions shown; findings below may reference images not displayed]

FINDINGS: Heart and mediastinal contours are within normal limits. No focal
opacities or effusions. No acute bony abnormality.
IMPRESSION: No active disease.

## 2023-11-07 ENCOUNTER — Encounter: Payer: Self-pay | Admitting: *Deleted

## 2023-11-07 NOTE — Progress Notes (Signed)
 Ronald Wilkinson                                          MRN: [id number]   11/07/2023   The [facility] Quality Team Specialist reviewed this patient medical record for the purposes of chart review for care gap closure. The following were reviewed: chart review for care gap closure-kidney health evaluation for diabetes:eGFR  and uACR.    [facility] Quality Team

## 2024-01-08 ENCOUNTER — Other Ambulatory Visit (HOSPITAL_COMMUNITY): Payer: Self-pay | Admitting: Internal Medicine

## 2024-01-08 DIAGNOSIS — F17200 Nicotine dependence, unspecified, uncomplicated: Secondary | ICD-10-CM

## 2024-01-16 NOTE — Progress Notes (Signed)
 Ronald Wilkinson                                          MRN: [id number]   01/16/2024   The [facility] Quality Team Specialist reviewed this patient medical record for the purposes of chart review for care gap closure. The following were reviewed: chart review for care gap closure-controlling blood pressure.    [facility] Quality Team

## 2024-01-25 ENCOUNTER — Ambulatory Visit (HOSPITAL_COMMUNITY)

## 2024-02-12 ENCOUNTER — Ambulatory Visit (HOSPITAL_COMMUNITY)
Admission: RE | Admit: 2024-02-12 | Discharge: 2024-02-12 | Disposition: A | Discharge status: Home / Self Care | Source: Ambulatory Visit | Attending: Internal Medicine | Admitting: Internal Medicine

## 2024-02-12 DIAGNOSIS — F17200 Nicotine dependence, unspecified, uncomplicated: Secondary | ICD-10-CM | POA: Diagnosis present
